# Patient Record
Sex: Male | Born: 2005 | Race: Black or African American | Hispanic: No | Marital: Single
Health system: Southern US, Community
[De-identification: ages and names within clinical notes are randomized; demographics above are authoritative.]

## PROBLEM LIST (undated history)

## (undated) DIAGNOSIS — F32A Depression, unspecified: Secondary | ICD-10-CM

## (undated) DIAGNOSIS — M722 Plantar fascial fibromatosis: Secondary | ICD-10-CM

## (undated) DIAGNOSIS — S24103A Unspecified injury at T7-T10 level of thoracic spinal cord, initial encounter: Secondary | ICD-10-CM

## (undated) DIAGNOSIS — Z87442 Personal history of urinary calculi: Secondary | ICD-10-CM

## (undated) DIAGNOSIS — R519 Headache, unspecified: Secondary | ICD-10-CM

## (undated) DIAGNOSIS — F419 Anxiety disorder, unspecified: Secondary | ICD-10-CM

## (undated) DIAGNOSIS — W3400XA Accidental discharge from unspecified firearms or gun, initial encounter: Secondary | ICD-10-CM

## (undated) HISTORY — PX: DG FOOT RIGHT COMPLETE (ARMC HX): HXRAD1522

---
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
## 2023-02-20 DIAGNOSIS — W3400XA Accidental discharge from unspecified firearms or gun, initial encounter: Secondary | ICD-10-CM

## 2023-02-20 DIAGNOSIS — Z978 Presence of other specified devices: Secondary | ICD-10-CM

## 2023-02-20 HISTORY — DX: Presence of other specified devices: Z97.8

## 2023-02-20 HISTORY — DX: Accidental discharge from unspecified firearms or gun, initial encounter: W34.00XA

## 2023-03-07 ENCOUNTER — Emergency Department (HOSPITAL_COMMUNITY): Payer: Medicaid Other

## 2023-03-07 ENCOUNTER — Encounter (HOSPITAL_COMMUNITY): Payer: Self-pay

## 2023-03-07 ENCOUNTER — Inpatient Hospital Stay (HOSPITAL_COMMUNITY)
Admission: EM | Admit: 2023-03-07 | Discharge: 2023-03-11 | DRG: 964 | Disposition: A | Discharge status: Rehabilitation Facility | Payer: Medicaid Other | Attending: Surgery | Admitting: Surgery

## 2023-03-07 ENCOUNTER — Other Ambulatory Visit: Payer: Self-pay

## 2023-03-07 DIAGNOSIS — S22089A Unspecified fracture of T11-T12 vertebra, initial encounter for closed fracture: Secondary | ICD-10-CM

## 2023-03-07 DIAGNOSIS — J942 Hemothorax: Secondary | ICD-10-CM

## 2023-03-07 DIAGNOSIS — W3400XA Accidental discharge from unspecified firearms or gun, initial encounter: Principal | ICD-10-CM

## 2023-03-07 DIAGNOSIS — G8221 Paraplegia, complete: Secondary | ICD-10-CM | POA: Diagnosis present

## 2023-03-07 DIAGNOSIS — S27321A Contusion of lung, unilateral, initial encounter: Secondary | ICD-10-CM | POA: Diagnosis present

## 2023-03-07 DIAGNOSIS — S272XXA Traumatic hemopneumothorax, initial encounter: Secondary | ICD-10-CM | POA: Diagnosis present

## 2023-03-07 DIAGNOSIS — S21239A Puncture wound without foreign body of unspecified back wall of thorax without penetration into thoracic cavity, initial encounter: Secondary | ICD-10-CM | POA: Diagnosis present

## 2023-03-07 DIAGNOSIS — F4322 Adjustment disorder with anxiety: Secondary | ICD-10-CM | POA: Diagnosis not present

## 2023-03-07 DIAGNOSIS — K592 Neurogenic bowel, not elsewhere classified: Secondary | ICD-10-CM | POA: Diagnosis present

## 2023-03-07 DIAGNOSIS — S2231XA Fracture of one rib, right side, initial encounter for closed fracture: Secondary | ICD-10-CM | POA: Diagnosis present

## 2023-03-07 DIAGNOSIS — Y92410 Unspecified street and highway as the place of occurrence of the external cause: Secondary | ICD-10-CM

## 2023-03-07 DIAGNOSIS — S36115A Moderate laceration of liver, initial encounter: Secondary | ICD-10-CM | POA: Diagnosis present

## 2023-03-07 DIAGNOSIS — F1220 Cannabis dependence, uncomplicated: Secondary | ICD-10-CM | POA: Diagnosis present

## 2023-03-07 DIAGNOSIS — S27802A Contusion of diaphragm, initial encounter: Secondary | ICD-10-CM | POA: Diagnosis present

## 2023-03-07 DIAGNOSIS — S2231XD Fracture of one rib, right side, subsequent encounter for fracture with routine healing: Secondary | ICD-10-CM | POA: Diagnosis not present

## 2023-03-07 DIAGNOSIS — F430 Acute stress reaction: Secondary | ICD-10-CM | POA: Diagnosis not present

## 2023-03-07 DIAGNOSIS — S24154A Other incomplete lesion at T11-T12 level of thoracic spinal cord, initial encounter: Secondary | ICD-10-CM | POA: Diagnosis present

## 2023-03-07 DIAGNOSIS — F4320 Adjustment disorder, unspecified: Secondary | ICD-10-CM | POA: Diagnosis present

## 2023-03-07 DIAGNOSIS — N319 Neuromuscular dysfunction of bladder, unspecified: Secondary | ICD-10-CM | POA: Diagnosis present

## 2023-03-07 LAB — I-STAT CHEM 8, ED
BUN: 11 mg/dL (ref 6–20)
Calcium, Ion: 1.14 mmol/L — ABNORMAL LOW (ref 1.15–1.40)
Chloride: 105 mmol/L (ref 98–111)
Creatinine, Ser: 0.9 mg/dL (ref 0.61–1.24)
Glucose, Bld: 188 mg/dL — ABNORMAL HIGH (ref 70–99)
HCT: 37 % — ABNORMAL LOW (ref 39.0–52.0)
Hemoglobin: 12.6 g/dL — ABNORMAL LOW (ref 13.0–17.0)
Potassium: 3.7 mmol/L (ref 3.5–5.1)
Sodium: 139 mmol/L (ref 135–145)
TCO2: 26 mmol/L (ref 22–32)

## 2023-03-07 LAB — URINALYSIS, ROUTINE W REFLEX MICROSCOPIC
Bilirubin Urine: NEGATIVE
Glucose, UA: NEGATIVE mg/dL
Hgb urine dipstick: NEGATIVE
Ketones, ur: NEGATIVE mg/dL
Nitrite: NEGATIVE
Protein, ur: NEGATIVE mg/dL
Specific Gravity, Urine: 1.035 — ABNORMAL HIGH (ref 1.005–1.030)
pH: 6 (ref 5.0–8.0)

## 2023-03-07 LAB — COMPREHENSIVE METABOLIC PANEL
ALT: 55 U/L — ABNORMAL HIGH (ref 0–44)
AST: 66 U/L — ABNORMAL HIGH (ref 15–41)
Albumin: 3.3 g/dL — ABNORMAL LOW (ref 3.5–5.0)
Alkaline Phosphatase: 37 U/L — ABNORMAL LOW (ref 38–126)
Anion gap: 12 (ref 5–15)
BUN: 10 mg/dL (ref 6–20)
CO2: 21 mmol/L — ABNORMAL LOW (ref 22–32)
Calcium: 8.3 mg/dL — ABNORMAL LOW (ref 8.9–10.3)
Chloride: 104 mmol/L (ref 98–111)
Creatinine, Ser: 1.05 mg/dL (ref 0.61–1.24)
GFR, Estimated: >60 mL/min (ref >60)
Glucose, Bld: 191 mg/dL — ABNORMAL HIGH (ref 70–99)
Potassium: 3.7 mmol/L (ref 3.5–5.1)
Sodium: 137 mmol/L (ref 135–145)
Total Bilirubin: 1.1 mg/dL (ref 0.3–1.2)
Total Protein: 5.5 g/dL — ABNORMAL LOW (ref 6.5–8.1)

## 2023-03-07 LAB — CBC
HCT: 37.6 % — ABNORMAL LOW (ref 39.0–52.0)
Hemoglobin: 11.6 g/dL — ABNORMAL LOW (ref 13.0–17.0)
MCH: 27.7 pg (ref 26.0–34.0)
MCHC: 30.9 g/dL (ref 30.0–36.0)
MCV: 89.7 fL (ref 80.0–100.0)
Platelets: 210 10*3/uL (ref 150–400)
RBC: 4.19 MIL/uL — ABNORMAL LOW (ref 4.22–5.81)
RDW: 13.8 % (ref 11.5–15.5)
WBC: 14.3 10*3/uL — ABNORMAL HIGH (ref 4.0–10.5)
nRBC: 0 % (ref 0.0–0.2)

## 2023-03-07 LAB — ETHANOL: Alcohol, Ethyl (B): <10 mg/dL (ref <10)

## 2023-03-07 LAB — HIV ANTIBODY (ROUTINE TESTING W REFLEX): HIV Screen 4th Generation wRfx: NONREACTIVE

## 2023-03-07 LAB — SAMPLE TO BLOOD BANK

## 2023-03-07 LAB — PROTIME-INR
INR: 1.3 — ABNORMAL HIGH (ref 0.8–1.2)
Prothrombin Time: 16 seconds — ABNORMAL HIGH (ref 11.4–15.2)

## 2023-03-07 LAB — LACTIC ACID, PLASMA: Lactic Acid, Venous: 2.7 mmol/L (ref 0.5–1.9)

## 2023-03-07 MED ORDER — OXYCODONE HCL 5 MG PO TABS
5.0000 mg | ORAL_TABLET | ORAL | Status: DC | PRN
  Administered 2023-03-07: 10 mg via ORAL
  Administered 2023-03-07: 5 mg via ORAL
  Administered 2023-03-08 – 2023-03-10 (×8): 10 mg via ORAL
  Filled 2023-03-07 (×5): qty 2
  Filled 2023-03-07: qty 1
  Filled 2023-03-07 (×5): qty 2

## 2023-03-07 MED ORDER — DOCUSATE SODIUM 100 MG PO CAPS
100.0000 mg | ORAL_CAPSULE | Freq: Two times a day (BID) | ORAL | Status: DC
  Administered 2023-03-07 – 2023-03-11 (×7): 100 mg via ORAL
  Filled 2023-03-07 (×8): qty 1

## 2023-03-07 MED ORDER — GABAPENTIN 300 MG PO CAPS
300.0000 mg | ORAL_CAPSULE | Freq: Three times a day (TID) | ORAL | Status: DC
  Administered 2023-03-07 – 2023-03-09 (×8): 300 mg via ORAL
  Filled 2023-03-07 (×8): qty 1

## 2023-03-07 MED ORDER — POLYETHYLENE GLYCOL 3350 17 G PO PACK
17.0000 g | PACK | Freq: Every day | ORAL | Status: DC | PRN
  Filled 2023-03-07: qty 1

## 2023-03-07 MED ORDER — SODIUM CHLORIDE 0.9 % IV SOLN
INTRAVENOUS | Status: AC | PRN
  Administered 2023-03-07: 1000 mL via INTRAVENOUS

## 2023-03-07 MED ORDER — METOPROLOL TARTRATE 5 MG/5ML IV SOLN: 5.0000 mg | Freq: Four times a day (QID) | INTRAVENOUS | Status: DC | PRN

## 2023-03-07 MED ORDER — FENTANYL CITRATE PF 50 MCG/ML IJ SOSY
PREFILLED_SYRINGE | INTRAMUSCULAR | Status: AC
  Filled 2023-03-07: qty 1

## 2023-03-07 MED ORDER — HYDRALAZINE HCL 20 MG/ML IJ SOLN: 10.0000 mg | INTRAMUSCULAR | Status: DC | PRN

## 2023-03-07 MED ORDER — ONDANSETRON HCL 4 MG/2ML IJ SOLN
4.0000 mg | Freq: Four times a day (QID) | INTRAMUSCULAR | Status: DC | PRN
  Administered 2023-03-09 (×2): 4 mg via INTRAVENOUS
  Filled 2023-03-07 (×3): qty 2

## 2023-03-07 MED ORDER — ACETAMINOPHEN 500 MG PO TABS
1000.0000 mg | ORAL_TABLET | Freq: Four times a day (QID) | ORAL | Status: DC
  Administered 2023-03-07 – 2023-03-11 (×16): 1000 mg via ORAL
  Filled 2023-03-07 (×16): qty 2

## 2023-03-07 MED ORDER — METHOCARBAMOL 500 MG PO TABS
500.0000 mg | ORAL_TABLET | Freq: Three times a day (TID) | ORAL | Status: AC
  Administered 2023-03-07 – 2023-03-10 (×8): 500 mg via ORAL
  Filled 2023-03-07 (×9): qty 1

## 2023-03-07 MED ORDER — ONDANSETRON 4 MG PO TBDP: 4.0000 mg | ORAL_TABLET | Freq: Four times a day (QID) | ORAL | Status: DC | PRN

## 2023-03-07 MED ORDER — IOHEXOL 350 MG/ML SOLN
75.0000 mL | Freq: Once | INTRAVENOUS | Status: AC | PRN
  Administered 2023-03-07: 75 mL via INTRAVENOUS

## 2023-03-07 MED ORDER — METHOCARBAMOL 1000 MG/10ML IJ SOLN
500.0000 mg | Freq: Three times a day (TID) | INTRAVENOUS | Status: AC
  Filled 2023-03-07: qty 5

## 2023-03-07 MED ORDER — MORPHINE SULFATE (PF) 4 MG/ML IV SOLN
4.0000 mg | INTRAVENOUS | Status: DC | PRN
  Administered 2023-03-07 – 2023-03-08 (×6): 4 mg via INTRAVENOUS
  Filled 2023-03-07 (×8): qty 1

## 2023-03-07 MED ORDER — FENTANYL CITRATE PF 50 MCG/ML IJ SOSY
50.0000 ug | PREFILLED_SYRINGE | Freq: Once | INTRAMUSCULAR | Status: AC
  Administered 2023-03-07: 50 ug via INTRAVENOUS

## 2023-03-07 NOTE — ED Notes (Signed)
Chest tube placed by Bedelia Person, MD
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
## 2023-03-07 NOTE — ED Notes (Signed)
Pt transported to CT with RN on monitor. 
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
## 2023-03-07 NOTE — TOC CAGE-AID Note (Signed)
Transition of Care [facility]) - CAGE-AID Screening   Patient Details  Name: Lee [name] MRN: [id number] Date of Birth: [birth year]     Hewitt Shorts, RN Trauma Response Nurse Phone Number: [phone] 03/07/2023, 7:01 PM     CAGE-AID Screening:    Have You Ever Felt You Ought to Cut Down on Your Drinking or Drug Use?: No Have People Annoyed You By Critizing Your Drinking Or Drug Use?: No Have You Felt Bad Or Guilty About Your Drinking Or Drug Use?: No Have You Ever Had a Drink or Used Drugs First Thing In The Morning to Steady Your Nerves or to Get Rid of a Hangover?: No CAGE-AID Score: 0  Substance Abuse Education Offered: No
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
## 2023-03-07 NOTE — Progress Notes (Signed)
Orthopedic Tech Progress Note Patient Details:  Lee [name] 2006-05-17 [id number]  Patient ID: [name], male   DOB: [birth year], 17 y.o.   MRN: [id number] Level I; not needed. Darleen Crocker 03/07/2023, 4:25 PM
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
## 2023-03-07 NOTE — ED Notes (Signed)
Date and time results received: 03/07/23 1704 (use smartphrase ".now" to insert current time)  Test: lactic acid Critical Value: 2.9  Name of Provider Notified: Adela Lank, MD  Orders Received? Or Actions Taken?:  notified

## 2023-03-07 NOTE — ED Notes (Signed)
 of blood out put from chest tube.

## 2023-03-08 ENCOUNTER — Inpatient Hospital Stay (HOSPITAL_COMMUNITY): Payer: Medicaid Other

## 2023-03-08 LAB — CBC
HCT: 36.2 % — ABNORMAL LOW (ref 39.0–52.0)
HCT: 36 % — ABNORMAL LOW (ref 39.0–52.0)
Hemoglobin: 11.4 g/dL — ABNORMAL LOW (ref 13.0–17.0)
Hemoglobin: 11.2 g/dL — ABNORMAL LOW (ref 13.0–17.0)
MCH: 28.3 pg (ref 26.0–34.0)
MCH: 27.5 pg (ref 26.0–34.0)
MCHC: 31.5 g/dL (ref 30.0–36.0)
MCHC: 31.1 g/dL (ref 30.0–36.0)
MCV: 89.8 fL (ref 80.0–100.0)
MCV: 88.5 fL (ref 80.0–100.0)
Platelets: 154 10*3/uL (ref 150–400)
Platelets: UNDETERMINED 10*3/uL (ref 150–400)
RBC: 4.03 MIL/uL — ABNORMAL LOW (ref 4.22–5.81)
RBC: 4.07 MIL/uL — ABNORMAL LOW (ref 4.22–5.81)
RDW: 13.8 % (ref 11.5–15.5)
RDW: 13.7 % (ref 11.5–15.5)
WBC: 14.3 10*3/uL — ABNORMAL HIGH (ref 4.0–10.5)
WBC: 10.5 10*3/uL (ref 4.0–10.5)
nRBC: 0 % (ref 0.0–0.2)
nRBC: 0 % (ref 0.0–0.2)

## 2023-03-08 LAB — BASIC METABOLIC PANEL
Anion gap: 12 (ref 5–15)
BUN: 9 mg/dL (ref 6–20)
CO2: 24 mmol/L (ref 22–32)
Calcium: 8.9 mg/dL (ref 8.9–10.3)
Chloride: 102 mmol/L (ref 98–111)
Creatinine, Ser: 0.95 mg/dL (ref 0.61–1.24)
GFR, Estimated: >60 mL/min (ref >60)
Glucose, Bld: 125 mg/dL — ABNORMAL HIGH (ref 70–99)
Potassium: 4.1 mmol/L (ref 3.5–5.1)
Sodium: 138 mmol/L (ref 135–145)
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
## 2023-03-08 NOTE — Progress Notes (Signed)
Orthopedic Tech Progress Note Patient Details:  Lee [name] Apr 12, 2006 [id number]  Ortho Devices Type of Ortho Device: Thoracolumbar corset (TLSO) Ortho Device/Splint Location: BACK Ortho Device/Splint Interventions: Ordered, Application, Adjustment   Post Interventions Patient Tolerated: Well Instructions Provided: Care of device  Donald Pore 03/08/2023, 5:07 PM

## 2023-03-09 ENCOUNTER — Inpatient Hospital Stay (HOSPITAL_COMMUNITY): Payer: Medicaid Other

## 2023-03-09 LAB — CBC
HCT: 33.1 % — ABNORMAL LOW (ref 39.0–52.0)
Hemoglobin: 10.3 g/dL — ABNORMAL LOW (ref 13.0–17.0)
MCH: 27.5 pg (ref 26.0–34.0)
MCHC: 31.1 g/dL (ref 30.0–36.0)
MCV: 88.5 fL (ref 80.0–100.0)
Platelets: 141 10*3/uL — ABNORMAL LOW (ref 150–400)
RBC: 3.74 MIL/uL — ABNORMAL LOW (ref 4.22–5.81)
RDW: 13.7 % (ref 11.5–15.5)
WBC: 7.9 10*3/uL (ref 4.0–10.5)
nRBC: 0 % (ref 0.0–0.2)

## 2023-03-09 LAB — BASIC METABOLIC PANEL
Anion gap: 8 (ref 5–15)
BUN: 7 mg/dL (ref 6–20)
CO2: 30 mmol/L (ref 22–32)
Calcium: 8.8 mg/dL — ABNORMAL LOW (ref 8.9–10.3)
Chloride: 98 mmol/L (ref 98–111)
Creatinine, Ser: 1.02 mg/dL (ref 0.61–1.24)
GFR, Estimated: >60 mL/min (ref >60)
Glucose, Bld: 101 mg/dL — ABNORMAL HIGH (ref 70–99)
Potassium: 3.9 mmol/L (ref 3.5–5.1)
Sodium: 136 mmol/L (ref 135–145)

## 2023-03-09 MED ORDER — LIDOCAINE 5 % EX PTCH
1.0000 | MEDICATED_PATCH | CUTANEOUS | Status: DC
  Administered 2023-03-09 – 2023-03-11 (×3): 1 via TRANSDERMAL
  Filled 2023-03-09 (×3): qty 1

## 2023-03-09 MED ORDER — SODIUM CHLORIDE 0.9 % IV BOLUS
1000.0000 mL | Freq: Once | INTRAVENOUS | Status: AC
  Administered 2023-03-09: 1000 mL via INTRAVENOUS

## 2023-03-09 MED ORDER — MORPHINE SULFATE (PF) 2 MG/ML IV SOLN
2.0000 mg | Freq: Four times a day (QID) | INTRAVENOUS | Status: DC | PRN
  Administered 2023-03-10: 2 mg via INTRAVENOUS
  Filled 2023-03-09: qty 1

## 2023-03-09 MED ORDER — KETOROLAC TROMETHAMINE 15 MG/ML IJ SOLN
30.0000 mg | Freq: Four times a day (QID) | INTRAMUSCULAR | Status: DC
  Administered 2023-03-09 – 2023-03-11 (×8): 30 mg via INTRAVENOUS
  Filled 2023-03-09 (×9): qty 2

## 2023-03-09 MED ORDER — DULOXETINE HCL 30 MG PO CPEP
30.0000 mg | ORAL_CAPSULE | Freq: Two times a day (BID) | ORAL | Status: DC
  Administered 2023-03-09 – 2023-03-11 (×5): 30 mg via ORAL
  Filled 2023-03-09 (×5): qty 1

## 2023-03-09 MED ORDER — CHLORHEXIDINE GLUCONATE CLOTH 2 % EX PADS
6.0000 | MEDICATED_PAD | Freq: Every day | CUTANEOUS | Status: DC
  Administered 2023-03-09 – 2023-03-11 (×3): 6 via TOPICAL

## 2023-03-09 MED ORDER — BOOST / RESOURCE BREEZE PO LIQD CUSTOM
1.0000 | Freq: Two times a day (BID) | ORAL | Status: DC
  Administered 2023-03-09 – 2023-03-11 (×5): 1 via ORAL
  Filled 2023-03-09: qty 1
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
## 2023-03-09 NOTE — Progress Notes (Signed)
? ?  Inpatient Rehab Admissions Coordinator : ? ?Per therapy recommendations, patient was screened for CIR candidacy by Edelin Fryer RN MSN.  At this time patient appears to be a potential candidate for CIR. I will place a rehab consult per protocol for full assessment. Please call me with any questions. ? ?Zayvon Alicea RN MSN ?Admissions Coordinator ?[phone] ?  ?

## 2023-03-09 NOTE — Progress Notes (Signed)
Discussed with Dr. Wynetta Emery, who has reviewed upright images. Okay to mobilize in brace. Begin therapies.   Diamantina Monks, MD General and Trauma Surgery [facility]

## 2023-03-09 NOTE — Consult Note (Signed)
  Patient seen and attempted to assess. He initially was difficult to arouse by calling of name, he did open his eyes briefly with physical shaking of upper left shoulder.  His grandmother [name]) was present at bedside who reports patient recently received medication for pain.  Patient did initially nod head to which she was comfortable with proceeding for psychiatric evaluation.  However he was unable to remain awake, and noted to again not handed for provider to return tomorrow once more stable and not under medications.  Grandmother denied having any immediate or acute concerns at this time for psychiatric provider.  Did discuss reasons for psychiatric consult, in which she verbally agreed psych consult was warranted.  She did states she will contact mother, who presented moments after providing had exited room.   Chart reviewed and verified patient recently received oxycodone 10 mg at 11:16 AM, also received a dose of Zofran 4 mg IV at 8:00 AM, in addition to Toradol 30 mg (nonsedating), gabapentin 300 mg p.o., and Cymbalta 30 mg p.o..  Although some medications are nonsedating, will revisit patient tomorrow to complete psychiatric evaluation for new paraplegia.  Psychiatry consult service will continue to follow.
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
## 2023-03-10 ENCOUNTER — Inpatient Hospital Stay (HOSPITAL_COMMUNITY): Payer: Medicaid Other

## 2023-03-10 LAB — BASIC METABOLIC PANEL
Anion gap: 7 (ref 5–15)
BUN: 10 mg/dL (ref 6–20)
CO2: 29 mmol/L (ref 22–32)
Calcium: 8.8 mg/dL — ABNORMAL LOW (ref 8.9–10.3)
Chloride: 99 mmol/L (ref 98–111)
Creatinine, Ser: 0.9 mg/dL (ref 0.61–1.24)
GFR, Estimated: >60 mL/min (ref >60)
Glucose, Bld: 93 mg/dL (ref 70–99)
Potassium: 4 mmol/L (ref 3.5–5.1)
Sodium: 135 mmol/L (ref 135–145)

## 2023-03-10 LAB — CBC
HCT: 31.9 % — ABNORMAL LOW (ref 39.0–52.0)
Hemoglobin: 10.3 g/dL — ABNORMAL LOW (ref 13.0–17.0)
MCH: 28.7 pg (ref 26.0–34.0)
MCHC: 32.3 g/dL (ref 30.0–36.0)
MCV: 88.9 fL (ref 80.0–100.0)
Platelets: 125 10*3/uL — ABNORMAL LOW (ref 150–400)
RBC: 3.59 MIL/uL — ABNORMAL LOW (ref 4.22–5.81)
RDW: 13.3 % (ref 11.5–15.5)
WBC: 6.6 10*3/uL (ref 4.0–10.5)
nRBC: 0 % (ref 0.0–0.2)

## 2023-03-10 MED ORDER — OXYCODONE HCL 5 MG PO TABS: 10.0000 mg | ORAL_TABLET | ORAL | Status: DC | PRN

## 2023-03-10 MED ORDER — BISACODYL 10 MG RE SUPP
10.0000 mg | Freq: Every day | RECTAL | Status: DC
  Administered 2023-03-10: 10 mg via RECTAL
  Filled 2023-03-10 (×2): qty 1

## 2023-03-10 MED ORDER — OXYCODONE HCL 5 MG PO TABS
10.0000 mg | ORAL_TABLET | ORAL | Status: DC | PRN
  Administered 2023-03-11: 10 mg via ORAL
  Filled 2023-03-10: qty 2

## 2023-03-10 MED ORDER — METHOCARBAMOL 500 MG PO TABS
1000.0000 mg | ORAL_TABLET | Freq: Three times a day (TID) | ORAL | Status: DC
  Administered 2023-03-10 – 2023-03-11 (×5): 1000 mg via ORAL
  Filled 2023-03-10 (×5): qty 2

## 2023-03-10 MED ORDER — GABAPENTIN 400 MG PO CAPS
400.0000 mg | ORAL_CAPSULE | Freq: Three times a day (TID) | ORAL | Status: DC
  Administered 2023-03-10 – 2023-03-11 (×5): 400 mg via ORAL
  Filled 2023-03-10 (×5): qty 1

## 2023-03-10 MED ORDER — ENOXAPARIN SODIUM 30 MG/0.3ML IJ SOSY
30.0000 mg | PREFILLED_SYRINGE | Freq: Two times a day (BID) | INTRAMUSCULAR | Status: DC
  Administered 2023-03-10 – 2023-03-11 (×2): 30 mg via SUBCUTANEOUS
  Filled 2023-03-10 (×2): qty 0.3

## 2023-03-10 MED ORDER — METHOCARBAMOL 1000 MG/10ML IJ SOLN: 500.0000 mg | Freq: Three times a day (TID) | INTRAVENOUS | Status: DC

## 2023-03-10 MED ORDER — POLYETHYLENE GLYCOL 3350 17 G PO PACK
17.0000 g | PACK | Freq: Every day | ORAL | Status: DC
  Administered 2023-03-10: 17 g via ORAL
  Filled 2023-03-10: qty 1
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
## 2023-03-10 NOTE — Plan of Care (Signed)
  Problem: Pain Managment: Goal: General experience of comfort will improve Outcome: Progressing   Problem: Education: Goal: Knowledge of General Education information will improve Description: Including pain rating scale, medication(s)/side effects and non-pharmacologic comfort measures Outcome: Progressing   Problem: Health Behavior/Discharge Planning: Goal: Ability to manage health-related needs will improve Outcome: Progressing   Problem: Activity: Goal: Risk for activity intolerance will decrease Outcome: Progressing   Problem: Coping: Goal: Level of anxiety will decrease Outcome: Progressing   Problem: Pain Managment: Goal: General experience of comfort will improve Outcome: Progressing   Problem: Skin Integrity: Goal: Risk for impaired skin integrity will decrease Outcome: Progressing
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
## 2023-03-11 ENCOUNTER — Other Ambulatory Visit: Payer: Self-pay

## 2023-03-11 ENCOUNTER — Inpatient Hospital Stay (HOSPITAL_COMMUNITY)
Admission: RE | Admit: 2023-03-11 | Discharge: 2023-04-08 | DRG: 945 | Disposition: A | Discharge status: Home / Self Care | Payer: Medicaid Other | Source: Intra-hospital | Attending: Physical Medicine and Rehabilitation | Admitting: Physical Medicine and Rehabilitation

## 2023-03-11 ENCOUNTER — Encounter (HOSPITAL_BASED_OUTPATIENT_CLINIC_OR_DEPARTMENT_OTHER): Payer: Self-pay | Admitting: Emergency Medicine

## 2023-03-11 DIAGNOSIS — S24104A Unspecified injury at T11-T12 level of thoracic spinal cord, initial encounter: Secondary | ICD-10-CM | POA: Diagnosis present

## 2023-03-11 DIAGNOSIS — A749 Chlamydial infection, unspecified: Secondary | ICD-10-CM | POA: Diagnosis present

## 2023-03-11 DIAGNOSIS — D62 Acute posthemorrhagic anemia: Secondary | ICD-10-CM | POA: Diagnosis present

## 2023-03-11 DIAGNOSIS — Z79899 Other long term (current) drug therapy: Secondary | ICD-10-CM | POA: Diagnosis not present

## 2023-03-11 DIAGNOSIS — S36115D Moderate laceration of liver, subsequent encounter: Secondary | ICD-10-CM

## 2023-03-11 DIAGNOSIS — K592 Neurogenic bowel, not elsewhere classified: Secondary | ICD-10-CM | POA: Diagnosis present

## 2023-03-11 DIAGNOSIS — S24154D Other incomplete lesion at T11-T12 level of thoracic spinal cord, subsequent encounter: Principal | ICD-10-CM

## 2023-03-11 DIAGNOSIS — S24114D Complete lesion at T11-T12 level of thoracic spinal cord, subsequent encounter: Principal | ICD-10-CM

## 2023-03-11 DIAGNOSIS — Z560 Unemployment, unspecified: Secondary | ICD-10-CM

## 2023-03-11 DIAGNOSIS — M549 Dorsalgia, unspecified: Secondary | ICD-10-CM | POA: Diagnosis present

## 2023-03-11 DIAGNOSIS — R519 Headache, unspecified: Secondary | ICD-10-CM | POA: Diagnosis not present

## 2023-03-11 DIAGNOSIS — S27329D Contusion of lung, unspecified, subsequent encounter: Secondary | ICD-10-CM | POA: Diagnosis not present

## 2023-03-11 DIAGNOSIS — G8221 Paraplegia, complete: Secondary | ICD-10-CM | POA: Diagnosis present

## 2023-03-11 DIAGNOSIS — K5900 Constipation, unspecified: Secondary | ICD-10-CM | POA: Diagnosis present

## 2023-03-11 DIAGNOSIS — S27809D Unspecified injury of diaphragm, subsequent encounter: Secondary | ICD-10-CM | POA: Diagnosis not present

## 2023-03-11 DIAGNOSIS — G8220 Paraplegia, unspecified: Secondary | ICD-10-CM | POA: Diagnosis present

## 2023-03-11 DIAGNOSIS — W3400XA Accidental discharge from unspecified firearms or gun, initial encounter: Secondary | ICD-10-CM

## 2023-03-11 DIAGNOSIS — S271XXD Traumatic hemothorax, subsequent encounter: Secondary | ICD-10-CM | POA: Diagnosis not present

## 2023-03-11 DIAGNOSIS — N319 Neuromuscular dysfunction of bladder, unspecified: Secondary | ICD-10-CM | POA: Diagnosis present

## 2023-03-11 DIAGNOSIS — S2231XD Fracture of one rib, right side, subsequent encounter for fracture with routine healing: Secondary | ICD-10-CM | POA: Diagnosis present

## 2023-03-11 DIAGNOSIS — R112 Nausea with vomiting, unspecified: Secondary | ICD-10-CM | POA: Diagnosis not present

## 2023-03-11 DIAGNOSIS — S24103A Unspecified injury at T7-T10 level of thoracic spinal cord, initial encounter: Secondary | ICD-10-CM

## 2023-03-11 DIAGNOSIS — F4320 Adjustment disorder, unspecified: Secondary | ICD-10-CM

## 2023-03-11 LAB — CBC
HCT: 32.6 % — ABNORMAL LOW (ref 39.0–52.0)
HCT: 32.5 % — ABNORMAL LOW (ref 39.0–52.0)
Hemoglobin: 10.3 g/dL — ABNORMAL LOW (ref 13.0–17.0)
Hemoglobin: 10.4 g/dL — ABNORMAL LOW (ref 13.0–17.0)
MCH: 27.5 pg (ref 26.0–34.0)
MCH: 28.3 pg (ref 26.0–34.0)
MCHC: 31.6 g/dL (ref 30.0–36.0)
MCHC: 32 g/dL (ref 30.0–36.0)
MCV: 86.9 fL (ref 80.0–100.0)
MCV: 88.3 fL (ref 80.0–100.0)
Platelets: 132 K/uL — ABNORMAL LOW (ref 150–400)
Platelets: 152 10*3/uL (ref 150–400)
RBC: 3.75 MIL/uL — ABNORMAL LOW (ref 4.22–5.81)
RBC: 3.68 MIL/uL — ABNORMAL LOW (ref 4.22–5.81)
RDW: 13.1 % (ref 11.5–15.5)
RDW: 13.2 % (ref 11.5–15.5)
WBC: 6.2 K/uL (ref 4.0–10.5)
WBC: 5.5 10*3/uL (ref 4.0–10.5)
nRBC: 0 % (ref 0.0–0.2)
nRBC: 0 % (ref 0.0–0.2)

## 2023-03-11 LAB — BASIC METABOLIC PANEL
Anion gap: 8 (ref 5–15)
BUN: 10 mg/dL (ref 6–20)
CO2: 26 mmol/L (ref 22–32)
Calcium: 8.7 mg/dL — ABNORMAL LOW (ref 8.9–10.3)
Chloride: 102 mmol/L (ref 98–111)
Creatinine, Ser: 0.8 mg/dL (ref 0.61–1.24)
GFR, Estimated: >60 mL/min (ref >60)
Glucose, Bld: 96 mg/dL (ref 70–99)
Potassium: 3.8 mmol/L (ref 3.5–5.1)
Sodium: 136 mmol/L (ref 135–145)

## 2023-03-11 LAB — CREATININE, SERUM
Creatinine, Ser: 0.86 mg/dL (ref 0.61–1.24)
GFR, Estimated: >60 mL/min (ref >60)

## 2023-03-11 MED ORDER — POLYETHYLENE GLYCOL 3350 17 G PO PACK
17.0000 g | PACK | Freq: Two times a day (BID) | ORAL | Status: DC
  Administered 2023-03-12 – 2023-03-21 (×16): 17 g via ORAL
  Filled 2023-03-11 (×20): qty 1

## 2023-03-11 MED ORDER — ACETAMINOPHEN 325 MG PO TABS
325.0000 mg | ORAL_TABLET | ORAL | Status: DC | PRN
  Administered 2023-03-19 – 2023-04-02 (×8): 650 mg via ORAL
  Filled 2023-03-11 (×9): qty 2

## 2023-03-11 MED ORDER — ONDANSETRON 4 MG PO TBDP
4.0000 mg | ORAL_TABLET | Freq: Four times a day (QID) | ORAL | Status: DC | PRN
  Administered 2023-03-19 – 2023-03-23 (×4): 4 mg via ORAL
  Filled 2023-03-11 (×4): qty 1

## 2023-03-11 MED ORDER — METHOCARBAMOL 500 MG PO TABS
1000.0000 mg | ORAL_TABLET | Freq: Three times a day (TID) | ORAL | Status: AC
  Administered 2023-03-11 – 2023-03-13 (×5): 1000 mg via ORAL
  Filled 2023-03-11 (×5): qty 2

## 2023-03-11 MED ORDER — ENOXAPARIN SODIUM 30 MG/0.3ML IJ SOSY
30.0000 mg | PREFILLED_SYRINGE | Freq: Two times a day (BID) | INTRAMUSCULAR | Status: DC
  Administered 2023-03-12 – 2023-04-05 (×49): 30 mg via SUBCUTANEOUS
  Filled 2023-03-11 (×50): qty 0.3

## 2023-03-11 MED ORDER — MAGNESIUM CITRATE PO SOLN
0.5000 | Freq: Once | ORAL | Status: AC
  Administered 2023-03-11: 0.5 via ORAL
  Filled 2023-03-11: qty 296

## 2023-03-11 MED ORDER — OXYCODONE HCL 5 MG PO TABS
10.0000 mg | ORAL_TABLET | ORAL | Status: DC | PRN
  Administered 2023-03-11: 10 mg via ORAL
  Administered 2023-03-12 (×2): 15 mg via ORAL
  Administered 2023-03-13: 10 mg via ORAL
  Administered 2023-03-14 – 2023-03-15 (×3): 15 mg via ORAL
  Administered 2023-03-15 (×2): 10 mg via ORAL
  Administered 2023-03-16: 15 mg via ORAL
  Administered 2023-03-16: 10 mg via ORAL
  Administered 2023-03-17: 15 mg via ORAL
  Administered 2023-03-17: 10 mg via ORAL
  Administered 2023-03-18 (×2): 15 mg via ORAL
  Administered 2023-03-19: 10 mg via ORAL
  Administered 2023-03-19 – 2023-03-21 (×2): 15 mg via ORAL
  Administered 2023-03-22 – 2023-03-23 (×2): 10 mg via ORAL
  Administered 2023-03-23 – 2023-03-30 (×8): 15 mg via ORAL
  Administered 2023-03-31: 10 mg via ORAL
  Administered 2023-04-01 – 2023-04-06 (×4): 15 mg via ORAL
  Filled 2023-03-11 (×7): qty 3
  Filled 2023-03-11 (×2): qty 2
  Filled 2023-03-11 (×7): qty 3
  Filled 2023-03-11: qty 2
  Filled 2023-03-11: qty 3
  Filled 2023-03-11 (×4): qty 2
  Filled 2023-03-11 (×2): qty 3
  Filled 2023-03-11: qty 2
  Filled 2023-03-11 (×3): qty 3
  Filled 2023-03-11 (×2): qty 2
  Filled 2023-03-11: qty 3
  Filled 2023-03-11: qty 2
  Filled 2023-03-11: qty 3
  Filled 2023-03-11: qty 2
  Filled 2023-03-11 (×2): qty 3

## 2023-03-11 MED ORDER — ORAL CARE MOUTH RINSE: 15.0000 mL | OROMUCOSAL | Status: DC | PRN

## 2023-03-11 MED ORDER — ONDANSETRON HCL 4 MG/2ML IJ SOLN
4.0000 mg | Freq: Four times a day (QID) | INTRAMUSCULAR | Status: DC | PRN
  Administered 2023-03-22: 4 mg via INTRAVENOUS
  Filled 2023-03-11: qty 2

## 2023-03-11 MED ORDER — MAGNESIUM HYDROXIDE 400 MG/5ML PO SUSP
30.0000 mL | Freq: Once | ORAL | Status: AC
  Administered 2023-03-11: 30 mL via ORAL
  Filled 2023-03-11: qty 30

## 2023-03-11 MED ORDER — BOOST / RESOURCE BREEZE PO LIQD CUSTOM
1.0000 | Freq: Two times a day (BID) | ORAL | Status: DC
  Administered 2023-03-12: 1 via ORAL

## 2023-03-11 MED ORDER — DULOXETINE HCL 30 MG PO CPEP
30.0000 mg | ORAL_CAPSULE | Freq: Two times a day (BID) | ORAL | Status: DC
  Administered 2023-03-11 – 2023-03-23 (×25): 30 mg via ORAL
  Filled 2023-03-11 (×25): qty 1

## 2023-03-11 MED ORDER — LIDOCAINE 5 % EX PTCH: 1.0000 | MEDICATED_PATCH | CUTANEOUS | Status: DC

## 2023-03-11 MED ORDER — DOCUSATE SODIUM 100 MG PO CAPS
100.0000 mg | ORAL_CAPSULE | Freq: Two times a day (BID) | ORAL | Status: DC
  Administered 2023-03-11 – 2023-03-18 (×15): 100 mg via ORAL
  Filled 2023-03-11 (×16): qty 1

## 2023-03-11 MED ORDER — BISACODYL 10 MG RE SUPP
10.0000 mg | Freq: Every day | RECTAL | Status: DC
  Filled 2023-03-11: qty 1

## 2023-03-11 MED ORDER — GABAPENTIN 400 MG PO CAPS
400.0000 mg | ORAL_CAPSULE | Freq: Three times a day (TID) | ORAL | Status: DC
  Administered 2023-03-11 – 2023-04-06 (×70): 400 mg via ORAL
  Filled 2023-03-11: qty 4
  Filled 2023-03-11 (×26): qty 1
  Filled 2023-03-11: qty 4
  Filled 2023-03-11 (×49): qty 1

## 2023-03-11 MED ORDER — POLYETHYLENE GLYCOL 3350 17 G PO PACK: 17.0000 g | PACK | Freq: Two times a day (BID) | ORAL | Status: DC

## 2023-03-11 MED ORDER — ENOXAPARIN SODIUM 30 MG/0.3ML IJ SOSY
30.0000 mg | PREFILLED_SYRINGE | Freq: Two times a day (BID) | INTRAMUSCULAR | Status: DC
  Administered 2023-03-11: 30 mg via SUBCUTANEOUS
  Filled 2023-03-11: qty 0.3

## 2023-03-11 MED ORDER — SENNA 8.6 MG PO TABS
2.0000 | ORAL_TABLET | Freq: Once | ORAL | Status: AC
  Administered 2023-03-11: 17.2 mg via ORAL
  Filled 2023-03-11: qty 2
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
## 2023-03-11 NOTE — Progress Notes (Signed)
Inpatient Rehabilitation Admission Medication Review by a Pharmacist  A complete drug regimen review was completed for this patient to identify any potential clinically significant medication issues.  High Risk Drug Classes Is patient taking? Indication by Medication  Antipsychotic No   Anticoagulant Yes Enoxaparin for DVT ppx  Antibiotic No   Opioid Yes Oxycodone for acute pain  Antiplatelet No   Hypoglycemics/insulin No   Vasoactive Medication No   Chemotherapy No   Other Yes Bisacodyl, docusate, miralax for bowel care Duloxetine and gabapentin for nerve pain Methocarbamol for muscle spasms Acetaminophen for mild pain Ondansetron for nausea      Type of Medication Issue Identified Description of Issue Recommendation(s)  Drug Interaction(s) (clinically significant)     Duplicate Therapy     Allergy     No Medication Administration End Date     Incorrect Dose     Additional Drug Therapy Needed     Significant med changes from prior encounter (inform family/care partners about these prior to discharge). All new for patient Review at discharge   Other  Scheduled tylenol not resumed from inpatient Consider resuming for pain control     Clinically significant medication issues were identified that warrant physician communication and completion of prescribed/recommended actions by midnight of the next day:  No  Name of provider notified for urgent issues identified:   Provider Method of Notification:     Pharmacist comments:   Time spent performing this drug regimen review (minutes):  15   Alphia Moh, PharmD, [place], [facility] Clinical Pharmacist  Please check AMION for all [facility] phone numbers After 10:00 PM, call Main Pharmacy [phone]
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
## 2023-03-11 NOTE — Progress Notes (Signed)
INPATIENT REHABILITATION ADMISSION NOTE   Arrival Method: Bed      Mental Orientation: Oriented X4   Assessment: done   Skin: assessed   IV'S: present   Pain: none   Tubes and Drains: foley in palce   Safety Measures: reviewed   Vital Signs: done   Height and Weight: done   Rehab Orientation: done   Family: at bedside    Notes: done  Marylu Lund, RN
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
## 2023-03-11 NOTE — Progress Notes (Signed)
Pt refused his suppository and miralax for tonight. Educated him on the importance of it and explained to him that it would be a daily medication that would be given. Pt verbalized understanding. Pt was given magnesium citrate from previous unit and states that he plans to drink the rest of that tonight.
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
## 2023-03-11 NOTE — Progress Notes (Signed)
IP rehab admissions - I met with patient, his mom, his grandmother and his aunt.  Patient lives with his dad.  Dad is currently not working but looking for a job.  Did was recently released after 14 years in prison.  Mom recently moved to [place] with her daughter.  Patient did not want to go to [place].  Mom pays for the rental house for dad and patient.  Patient would like to go home with dad.  His 2nd choice would be to go home with his aunt Lee [name].  Mom feels very stressed with current situation.  She is also looking for other housing for patient that would be more w/c accessible.  For now, the plan is home to dad's home with help of mom and aunt.  Patient has been cleared for CIR today and we can admit to CIr today.  Call me for questions.  [phone]

## 2023-03-12 ENCOUNTER — Encounter (HOSPITAL_COMMUNITY): Payer: Self-pay | Admitting: Physical Medicine and Rehabilitation

## 2023-03-12 ENCOUNTER — Inpatient Hospital Stay (HOSPITAL_COMMUNITY): Payer: Medicaid Other

## 2023-03-12 DIAGNOSIS — G8221 Paraplegia, complete: Secondary | ICD-10-CM

## 2023-03-12 DIAGNOSIS — K592 Neurogenic bowel, not elsewhere classified: Secondary | ICD-10-CM | POA: Diagnosis present

## 2023-03-12 DIAGNOSIS — N319 Neuromuscular dysfunction of bladder, unspecified: Secondary | ICD-10-CM

## 2023-03-12 DIAGNOSIS — M7989 Other specified soft tissue disorders: Secondary | ICD-10-CM

## 2023-03-12 LAB — CBC WITH DIFFERENTIAL/PLATELET
Abs Immature Granulocytes: 0.04 10*3/uL (ref 0.00–0.07)
Basophils Absolute: 0 10*3/uL (ref 0.0–0.1)
Basophils Relative: 1 %
Eosinophils Absolute: 0.2 10*3/uL (ref 0.0–0.5)
Eosinophils Relative: 4 %
HCT: 31 % — ABNORMAL LOW (ref 39.0–52.0)
Hemoglobin: 10 g/dL — ABNORMAL LOW (ref 13.0–17.0)
Immature Granulocytes: 1 %
Lymphocytes Relative: 27 %
Lymphs Abs: 1.4 10*3/uL (ref 0.7–4.0)
MCH: 27.6 pg (ref 26.0–34.0)
MCHC: 32.3 g/dL (ref 30.0–36.0)
MCV: 85.6 fL (ref 80.0–100.0)
Monocytes Absolute: 0.6 10*3/uL (ref 0.1–1.0)
Monocytes Relative: 11 %
Neutro Abs: 3.1 10*3/uL (ref 1.7–7.7)
Neutrophils Relative %: 56 %
Platelets: 167 10*3/uL (ref 150–400)
RBC: 3.62 MIL/uL — ABNORMAL LOW (ref 4.22–5.81)
RDW: 13.3 % (ref 11.5–15.5)
WBC: 5.4 10*3/uL (ref 4.0–10.5)
nRBC: 0 % (ref 0.0–0.2)

## 2023-03-12 LAB — COMPREHENSIVE METABOLIC PANEL
ALT: 63 U/L — ABNORMAL HIGH (ref 0–44)
AST: 38 U/L (ref 15–41)
Albumin: 2.8 g/dL — ABNORMAL LOW (ref 3.5–5.0)
Alkaline Phosphatase: 50 U/L (ref 38–126)
Anion gap: 8 (ref 5–15)
BUN: 11 mg/dL (ref 6–20)
CO2: 30 mmol/L (ref 22–32)
Calcium: 8.6 mg/dL — ABNORMAL LOW (ref 8.9–10.3)
Chloride: 100 mmol/L (ref 98–111)
Creatinine, Ser: 0.83 mg/dL (ref 0.61–1.24)
GFR, Estimated: >60 mL/min (ref >60)
Glucose, Bld: 92 mg/dL (ref 70–99)
Potassium: 3.7 mmol/L (ref 3.5–5.1)
Sodium: 138 mmol/L (ref 135–145)
Total Bilirubin: 0.7 mg/dL (ref 0.3–1.2)
Total Protein: 5.7 g/dL — ABNORMAL LOW (ref 6.5–8.1)

## 2023-03-12 MED ORDER — LIDOCAINE 5 % EX PTCH
3.0000 | MEDICATED_PATCH | CUTANEOUS | Status: DC
  Administered 2023-03-12 – 2023-03-14 (×3): 3 via TRANSDERMAL
  Filled 2023-03-12 (×7): qty 3

## 2023-03-12 MED ORDER — CHLORHEXIDINE GLUCONATE CLOTH 2 % EX PADS
6.0000 | MEDICATED_PAD | Freq: Every day | CUTANEOUS | Status: DC
  Administered 2023-03-12 – 2023-03-14 (×3): 6 via TOPICAL

## 2023-03-12 MED ORDER — MORPHINE SULFATE ER 15 MG PO TBCR
15.0000 mg | EXTENDED_RELEASE_TABLET | Freq: Two times a day (BID) | ORAL | Status: DC
  Administered 2023-03-12 – 2023-04-08 (×53): 15 mg via ORAL
  Filled 2023-03-12 (×57): qty 1

## 2023-03-12 MED ORDER — BISACODYL 10 MG RE SUPP
10.0000 mg | Freq: Every day | RECTAL | Status: DC
  Administered 2023-03-12 – 2023-04-07 (×28): 10 mg via RECTAL
  Filled 2023-03-12 (×32): qty 1

## 2023-03-12 MED ORDER — ENSURE ENLIVE PO LIQD
237.0000 mL | Freq: Two times a day (BID) | ORAL | Status: DC
  Administered 2023-03-12 – 2023-04-07 (×26): 237 mL via ORAL

## 2023-03-12 MED ORDER — CHLORHEXIDINE GLUCONATE CLOTH 2 % EX PADS: 6.0000 | MEDICATED_PAD | Freq: Every day | CUTANEOUS | Status: DC

## 2023-03-12 NOTE — Plan of Care (Signed)
  Problem: RH Balance Goal: LTG: Patient will maintain dynamic sitting balance (OT) Description: LTG:  Patient will maintain dynamic sitting balance with assistance during activities of daily living (OT) Flowsheets (Taken 03/12/2023 1248) LTG: Pt will maintain dynamic sitting balance during ADLs with: Supervision/Verbal cueing   Problem: RH Grooming Goal: LTG Patient will perform grooming w/assist,cues/equip (OT) Description: LTG: Patient will perform grooming with assist, with/without cues using equipment (OT) Flowsheets (Taken 03/12/2023 1248) LTG: Pt will perform grooming with assistance level of: Independent with assistive device    Problem: RH Bathing Goal: LTG Patient will bathe all body parts with assist levels (OT) Description: LTG: Patient will bathe all body parts with assist levels (OT) Flowsheets (Taken 03/12/2023 1248) LTG: Pt will perform bathing with assistance level/cueing: Supervision/Verbal cueing   Problem: RH Dressing Goal: LTG Patient will perform upper body dressing (OT) Description: LTG Patient will perform upper body dressing with assist, with/without cues (OT). Flowsheets (Taken 03/12/2023 1248) LTG: Pt will perform upper body dressing with assistance level of: Independent with assistive device Goal: LTG Patient will perform lower body dressing w/assist (OT) Description: LTG: Patient will perform lower body dressing with assist, with/without cues in positioning using equipment (OT) Flowsheets (Taken 03/12/2023 1248) LTG: Pt will perform lower body dressing with assistance level of: Minimal Assistance - Patient > 75% Note: In long sitting with AE    Problem: RH Toileting Goal: LTG Patient will perform toileting task (3/3 steps) with assistance level (OT) Description: LTG: Patient will perform toileting task (3/3 steps) with assistance level (OT)  Flowsheets (Taken 03/12/2023 1248) LTG: Pt will perform toileting task (3/3 steps) with assistance level: Minimal  Assistance - Patient > 75% Note: Including self-cathing    Problem: RH Toilet Transfers Goal: LTG Patient will perform toilet transfers w/assist (OT) Description: LTG: Patient will perform toilet transfers with assist, with/without cues using equipment (OT) Flowsheets (Taken 03/12/2023 1248) LTG: Pt will perform toilet transfers with assistance level of: Contact Guard/Touching assist Note: DABSC or padded TTB    Problem: RH Tub/Shower Transfers Goal: LTG Patient will perform tub/shower transfers w/assist (OT) Description: LTG: Patient will perform tub/shower transfers with assist, with/without cues using equipment (OT) Flowsheets (Taken 03/12/2023 1248) LTG: Pt will perform tub/shower stall transfers with assistance level of: Contact Guard/Touching assist

## 2023-03-12 NOTE — Discharge Instructions (Addendum)
Inpatient Rehab Discharge Instructions  Lee [name] Discharge date and time: No discharge date for patient encounter.   Activities/Precautions/ Functional Status: Activity: Back brace when out of bed Diet: regular diet Wound Care: Routine skin checks Functional status:  ___ No restrictions     ___ Walk up steps independently ___ 24/7 supervision/assistance   ___ Walk up steps with assistance ___ Intermittent supervision/assistance  ___ Bathe/dress independently ___ Walk with walker     _x__ Bathe/dress with assistance ___ Walk Independently    ___ Shower independently ___ Walk with assistance    ___ Shower with assistance ___ No alcohol     ___ Return to work/school ________  Special Instructions:  No driving smoking or alcohol   COMMUNITY REFERRALS UPON DISCHARGE:    Outpatient: PT     OT             Agency: [facility]    Phone: [phone]             Appointment Date/Time: *Please expect follow-up within 7-10 business days to schedule your visit. If you have not received follow-up, be sure to contact the site directly.*  Medical Equipment/Items Ordered:transfer board and tub transfer bench                                                 Agency/Supplier:[facility] [phone]   Medical Equipment/Items Ordered:loaner wheelchair                                                 Agency/Supplier: [name] [phone]  My questions have been answered and I understand these instructions. I will adhere to these goals and the provided educational materials after my discharge from the hospital.  Patient/Caregiver Signature _______________________________ Date __________  Clinician Signature _______________________________________ Date __________  Please bring this form and your medication list with you to all your follow-up doctor's appointments.
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
## 2023-03-12 NOTE — Progress Notes (Signed)
Lower extremity venous bilateral study completed.   Please see CV Proc for preliminary results.   Donesha Wallander, RDMS, RVT  
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
## 2023-03-12 NOTE — Progress Notes (Signed)
IP Rehab Bowel Program Documentation   Bowel Program Start time [phone]   Dig Stim Indicated? Yes  Dig Stim Prior to Suppository or mini Enema X 1   Output from dig stim: none   Ordered intervention: Suppository Yes , mini enema No ,   Repeat dig stim after Suppository or Mini enema  X 1,  Output? No, only smear   Bowel Program Complete? No , handoff given yes  Repeat Dig stim x 1- bowel program now complete. Yes, completed by Irfa, RN and Pilar Grammes, RN   Patient Tolerated? Yes

## 2023-03-12 NOTE — Progress Notes (Signed)
Inpatient Rehabilitation  Patient information reviewed and entered into eRehab system by Liridona Mashaw M. Laurelle Skiver, M.A., CCC/SLP, PPS Coordinator.  Information including medical coding, functional ability and quality indicators will be reviewed and updated through discharge.    

## 2023-03-12 NOTE — Progress Notes (Signed)
Orthopedic Tech Progress Note Patient Details:  Lee [name] Jan 01, 2006 [id number]  Applied PRAFO boots bilaterally.  Ortho Devices Type of Ortho Device: Prafo boot/shoe Ortho Device/Splint Location: BLE Ortho Device/Splint Interventions: Ordered, Application, Adjustment   Post Interventions Patient Tolerated: Well Instructions Provided: Care of device, Adjustment of device, Poper ambulation with device  Blase Mess 03/12/2023, 10:06 AM
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
## 2023-03-12 NOTE — Progress Notes (Signed)
Patient ID: Lee [name], male   DOB: 2006/04/19, 16 y.o.   MRN: [id number]  SW spoke with pt mother Lee [name] [phone]) to introduce self, explain role, discuss discharge process. Pt mother confirms that pt will d/c to home with his father and support from her sister Lee [name]. States that she will be travelling back and forth from [place]. SW discussed limitations with pt being uninsured. She is unsure on status of medicaid application as it was recently completed. Per [facility] account, application submitted on 6/18. Pt mother intends to explore SSDI again, as she reports he was receiving for a medical condition prior to him working last Summer in which it stopped. SW shared will see if there is additional assistance to help with application but unsure since he had SSDI in the past, and she may have to initiate on her own. SW will follow-up. SW discussed beginning to look for DME: w/c, 3in1 BSC, and TTB. SW shared will continue to provide updates as available.   Cecile Sheerer, MSW, LCSWA Office: [phone] Cell: [phone] Fax: [phone]

## 2023-03-12 NOTE — Progress Notes (Signed)
Inpatient Rehabilitation Care Coordinator Assessment and Plan Patient Details  Name: Lee [name] MRN: [id number] Date of Birth: 2005-12-06  Today's Date: 03/12/2023  Hospital Problems: Principal Problem:   T12 spinal cord injury [facility]) Active Problems:   Complete paraplegia [facility])   Neurogenic bowel   Neurogenic bladder  Past Medical History:  Past Medical History:  Diagnosis Date   Plantar fibromatosis    Past Surgical History: No past surgical history on file. Social History:  reports that he does not drink alcohol and does not use drugs. No history on file for tobacco use.  Family / Support Systems Marital Status: Single Spouse/Significant Other: N/a Children: N/A Other Supports: various family members Anticipated Caregiver: father and aunt Myesha Ability/Limitations of Caregiver: Pt mother will be travelling back and forth from [place]. Pt will have support from his aunt and father, and various supports to check in throughout the day. Caregiver Availability: 24/7 Family Dynamics: Pt lives with his father.  Social History Preferred language: English Religion: Baptist Cultural Background: Pt was working at [employer] about 8-9 months ago. He was receiving disability for a medical condition. Mother reports that it was discontinued once he started working last summer. Education: He is assigned to [employer], and was waiting to transition to a new school. He was last in school Dec 2023. Health Literacy - How often do you need to have someone help you when you read instructions, pamphlets, or other written material from your doctor or pharmacy?: Never Writes: Yes Employment Status: Unemployed Date Retired/Disabled/Unemployed: August 2023 Legal History/Current Legal Issues: Pt admits that he has an assigned Warden/ranger- [employer]. They are unaware on when he will be discharged. Reports they receive phone calls when they need to show up for  court. Guardian/Conservator: N/A. Pt allows his mother to make medical decisions.   Abuse/Neglect Abuse/Neglect Assessment Can Be Completed: Yes Physical Abuse: Denies Verbal Abuse: Denies Sexual Abuse: Denies Exploitation of patient/patient's resources: Denies Self-Neglect: Denies  Patient response to: Social Isolation - How often do you feel lonely or isolated from those around you?: Never  Emotional Status Pt's affect, behavior and adjustment status: Pt in good spirits at time of visit as surrounded by his family. Family is very supportive in his desire to walk to allow him to keep his drive. Recent Psychosocial Issues: Denies Psychiatric History: Denies. Pt has been being seen by psychiatry while in hospital. Pt scheduled to see Dr. Kieth Brightly. Substance Abuse History: Pt admits to daily marijuana use PTA. Last use was 6/18.  Patient / Family Perceptions, Expectations & Goals Pt/Family understanding of illness & functional limitations: Pt and family have a general understanding of pt care needs Premorbid pt/family roles/activities: Independent Anticipated changes in roles/activities/participation: Assistance with ADLs/IADLs Pt/family expectations/goals: Pt goal is to walk.  Community Resources [employer]: None Premorbid Home Care/DME Agencies: None Transportation available at discharge: TBD Is the patient able to respond to transportation needs?: Yes In the past 12 months, has lack of transportation kept you from medical appointments or from getting medications?: No In the past 12 months, has lack of transportation kept you from meetings, work, or from getting things needed for daily living?: No Resource referrals recommended: Neuropsychology  Discharge Planning Living Arrangements: Parent Support Systems: Parent, Other relatives Type of Residence: Private residence Insurance Resources: Customer service manager Resources: Family Support Financial Screen Referred:  Previously completed Living Expenses: Lives with family Money Management: Family Does the patient have any problems obtaining your medications?: No Home Management:  Pt helped manage home care needs with his father. Patient/Family Preliminary Plans: TBD Care Coordinator Barriers to Discharge: Insurance for SNF coverage, Home environment access/layout Care Coordinator Anticipated Follow Up Needs: HH/OP  Clinical Impression SW met with pt, pt mother, pt grandmother and aunt in room. Pt is not a Cytogeneticist. No DME. Pt is eager to walk again, and remains motivated to make progress here in rehab.   Gretchen Short 03/12/2023, 4:28 PM

## 2023-03-12 NOTE — Plan of Care (Signed)
  Problem: RH Balance Goal: LTG Patient will maintain dynamic sitting balance (PT) Description: LTG:  Patient will maintain dynamic sitting balance with assistance during mobility activities (PT) Flowsheets (Taken 03/12/2023 1253) LTG: Pt will maintain dynamic sitting balance during mobility activities with:: Independent   Problem: RH Bed Mobility Goal: LTG Patient will perform bed mobility with assist (PT) Description: LTG: Patient will perform bed mobility with assistance, with/without cues (PT). Flowsheets (Taken 03/12/2023 1253) LTG: Pt will perform bed mobility with assistance level of: Independent   Problem: RH Bed to Chair Transfers Goal: LTG Patient will perform bed/chair transfers w/assist (PT) Description: LTG: Patient will perform bed to chair transfers with assistance (PT). Flowsheets (Taken 03/12/2023 1253) LTG: Pt will perform Bed to Chair Transfers with assistance level: Independent with assistive device  Note: With LRAD   Problem: RH Car Transfers Goal: LTG Patient will perform car transfers with assist (PT) Description: LTG: Patient will perform car transfers with assistance (PT). Flowsheets (Taken 03/12/2023 1253) LTG: Pt will perform car transfers with assist:: Set up assist  Note: With LRAD   Problem: RH Wheelchair Mobility Goal: LTG Patient will propel w/c in controlled environment (PT) Description: LTG: Patient will propel wheelchair in controlled environment, # of feet with assist (PT) Flowsheets (Taken 03/12/2023 1253) LTG: Pt will propel w/c in controlled environ  assist needed:: Independent with assistive device LTG: Propel w/c distance in controlled environment: 500' Goal: LTG Patient will propel w/c in home environment (PT) Description: LTG: Patient will propel wheelchair in home environment, # of feet with assistance (PT). Flowsheets (Taken 03/12/2023 1253) LTG: Pt will propel w/c in home environ  assist needed:: Independent with assistive device Distance:  wheelchair distance in controlled environment: 50 Note: Navigating obstacles, thresholds and doorways   Problem: RH Pre-functional/Other (Specify) Goal: RH LTG PT (Specify) 1 Description:  RH LTG PT (Specify) 1 Flowsheets (Taken 03/12/2023 1253) LTG: Other PT (Specify) 1: Pt will tolerate standing in standing frame up to 5 minutes as tolerance to standing upright as well as to decrease fracture risk with weightbearing to BLEs
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
## 2023-03-13 NOTE — Progress Notes (Signed)
IP Rehab Bowel Program Documentation   Bowel Program Start time [phone]   Dig Stim Indicated? Yes  Dig Stim Prior to Suppository or mini Enema X 1   Output from dig stim: none   Ordered intervention: Suppository Yes , mini enema No ,   Repeat dig stim after Suppository or Mini enema  X 1,  Output?  No  Bowel Program Complete? No , handoff given yes to night shift  Repeat Dig. Stim x1  Bowel program now complete; No Output. Pilar Grammes, RN   Patient Tolerated? Yes

## 2023-03-14 MED ORDER — PSYLLIUM 95 % PO PACK
1.0000 | PACK | Freq: Every day | ORAL | Status: DC
  Administered 2023-03-15 – 2023-04-08 (×6): 1 via ORAL
  Filled 2023-03-14 (×15): qty 1

## 2023-03-14 MED ORDER — CALCIUM CARBONATE ANTACID 500 MG PO CHEW
1.0000 | CHEWABLE_TABLET | Freq: Every day | ORAL | Status: DC
  Administered 2023-03-14 – 2023-03-17 (×4): 200 mg via ORAL
  Filled 2023-03-14 (×4): qty 1

## 2023-03-14 NOTE — Progress Notes (Signed)
Physical Therapy Session Note  Patient Details  Name: Lee [name] MRN: [id number] Date of Birth: [birth year]  Today's Date: 03/14/2023 PT Individual Time: 0802-0916 PT Individual Time Calculation (min): 74 min   Short Term Goals: Week 1:  PT Short Term Goal 1 (Week 1): Pt will complete bed mobility with max assist of 1 person PT Short Term Goal 2 (Week 1): Pt will complete slideboard transfer with mod assist of 1 person PT Short Term Goal 3 (Week 1): Pt will tolerate sitting upright in wheelchair for 4 hours outside of therapy PT Short Term Goal 4 (Week 1): Pt will correctly verbalize frequency and type of seated pressure relief to decrease risk for pressure injuries PT Short Term Goal 5 (Week 1): Pt will manage all WC parts as well as BLEs on/off leg rests with supervision  Skilled Therapeutic Interventions/Progress Updates: Pt presents supine in bed and agreeable to therapy, wanting to get OOB.  Pt does state pain of 10/10 and meds given during session as well as lidoderm patches to back and R sided abdomen.  Pt required total A for threading feet and catheter through shorts.  Pt rolled side to side w/  mod A and positioning knees in hooklying for total A to pull shorts over hips, place lidoderm patches and don TLSO.  Pt able to maintain sidelying position.  Pt donned pull over shirt in supine w/ completion in sidelying.  Total A for donning shoes.  Pt performed log roll technique for sidelying to sit w/ Total A for LES and then pt assists using siderails.  Pt required CGA to mod A to maintain seated balance at EOB.  Pt required max A of 1 for sidebending R and to lift LLE, but pt positioning SB w/ min A from +2.  Pt able to maintain forward balance during transfer.  PT w/ min blocking of B knees and mod A for sequential scoot across SB, w/ re-positioning of LES, +2 for CGA of trunk.  Pt negotiated w/c in front of sink, performing mouth hygiene.  Pt negotiated w/c w/ supervision out of room through  crowded hallways , verbal cues for increased propulsion.  Pt returned to room.  Pt performed SB transfer w/ mod A .  Shoes doffed at EOB.  Pt transferred sit to sidelying, then supine w/ mod A for LES.  Pt positioned w/ pillows B sides of trunk for comfort, heels elevated on pillows.  Bed alarm on and all needs in reach.     Therapy Documentation Precautions:  Precautions Precautions: Fall, Other (comment), Back Precaution Comments: paraplegia, chest tube Required Braces or Orthoses: Spinal Brace Spinal Brace: Thoracolumbosacral orthotic, Applied in supine position Restrictions Weight Bearing Restrictions: No General:   Vital Signs:   Pain:10/10 Pain Assessment Pain Scale: 0-10 Pain Score: 10-Worst pain ever Pain Type: Acute pain Pain Location: Back Pain Orientation: Lower Pain Descriptors / Indicators: Aching;Discomfort Pain Frequency: Constant Pain Onset: Gradual Pain Intervention(s): Medication (See eMAR)      Therapy/Group: Individual Therapy  Lucio Edward 03/14/2023, 10:48 AM
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
## 2023-03-14 NOTE — Progress Notes (Signed)
Physical Therapy Session Note  Patient Details  Name: Lee [name] MRN: [id number] Date of Birth: [birth year]  Today's Date: 03/14/2023 PT Individual Time: 1306-1420 PT Individual Time Calculation (min): 74 min   Short Term Goals: Week 1:  PT Short Term Goal 1 (Week 1): Pt will complete bed mobility with max assist of 1 person PT Short Term Goal 2 (Week 1): Pt will complete slideboard transfer with mod assist of 1 person PT Short Term Goal 3 (Week 1): Pt will tolerate sitting upright in wheelchair for 4 hours outside of therapy PT Short Term Goal 4 (Week 1): Pt will correctly verbalize frequency and type of seated pressure relief to decrease risk for pressure injuries PT Short Term Goal 5 (Week 1): Pt will manage all WC parts as well as BLEs on/off leg rests with supervision  Skilled Therapeutic Interventions/Progress Updates: Pt presented in bed agreeable to therapy. PTA met with grandmother outside room prior to entering with her providing info regarding possible PTSD (advised very likely) and that family is in discussion regarding after CIR to transfer up to [facility] to go to [facility] for further rehab. Pt indicated pain 9/10, RN notified and received pain meds prior to OOB mobility. Pt performed rolling L/R with minA for BLE management to don brace. Performed supine to sit with maxA (+2 present for safety) and pt scooted anteriorly towards HOB with minA. Pt able to lean to R to allow placement of Slide board. With increased time performed Slide board transfer to L with modA and cues for improved head hips relationship. Pt then propelled to day room with supervision and able to lock/unlock breaks with verbal cues only. Performed Slide board transfer to R with modA to high/low mat. At [facility] provided education on use of leg loops and demonstrating however advised will not implement today to work on core strengthening. Pt then participated in sitting balance activities with pt working on bringing  hands forwards vs propping self up with hands posteriorly. Pt also worked on dynamic sitting balance catching small basketball with one hand with PTA slowly increasing lateral challenges. Pt was able to progress to catching then throwing to basketball net. Pt required intermittent breaks to regain positioning and maintain balance. Pt required overall minA for more moderate challenges with pt able to use BUE for protective responses. Pt then ultimately worked on catching with BUE and tossing to basketball net. Pt was able to tolerate this activity for ~75min with intermittent breaks. While performing activity pt indicated no increase in pain while performing activity but prior to transferring back to w/c indicated increased pain in back from unsupported sitting. Pt required maxA for Slide board set up and performed transfer to L back to w/c with minA. Pt propelled back to room in same manner as prior and requesting to remain in w/c to go outside with family (has grounds pass). Pt left in w/c with family present and current needs met.      Therapy Documentation Precautions:  Precautions Precautions: Fall, Other (comment), Back Precaution Comments: paraplegia, chest tube Required Braces or Orthoses: Spinal Brace Spinal Brace: Thoracolumbosacral orthotic, Applied in supine position Restrictions Weight Bearing Restrictions: No General:   Vital Signs: Therapy Vitals Temp: 98.7 F (37.1 C) Pulse Rate: 89 Resp: 17 BP: 135/82 Patient Position (if appropriate): Sitting Oxygen Therapy SpO2: 93 % O2 Device: Room Air Pain: Pain Assessment Pain Scale: 0-10 Pain Score: 9  Pain Type: Acute pain Pain Location: Neck Pain Orientation: Posterior Pain Descriptors / Indicators: Aching  Pain Frequency: Intermittent Pain Onset: Progressive Pain Intervention(s): Medication (See eMAR) Mobility:   Locomotion :    Trunk/Postural Assessment :    Balance:   Exercises:   Other Treatments:       Therapy/Group: Individual Therapy  Chirag Krueger 03/14/2023, 4:19 PM

## 2023-03-14 NOTE — IPOC Note (Signed)
Overall Plan of Care [facility]) Patient Details Name: Lee [name] MRN: [id number] DOB: 2006-09-09  Admitting Diagnosis: T12 spinal cord injury [facility])  Hospital Problems: Principal Problem:   T12 spinal cord injury (HCC) Active Problems:   Complete paraplegia (HCC)   Neurogenic bowel   Neurogenic bladder     Functional Problem List: Nursing Safety, Bladder, Bowel, Sensory, Skin Integrity, Edema, Endurance, Medication Management, Motor, Pain  PT Balance, Sensory, Motor, Pain, Endurance, Safety  OT Balance, Endurance, Behavior, Sensory, Edema, Pain, Skin Integrity  SLP    TR         Basic ADL's: OT Grooming, Toileting, Bathing, Dressing     Advanced  ADL's: OT Simple Meal Preparation, Laundry     Transfers: PT Bed to Chair, Bed Mobility, Car  OT Toilet, Research scientist (life sciences): PT Wheelchair Mobility     Additional Impairments: OT None  SLP        TR      Anticipated Outcomes Item Anticipated Outcome  Self Feeding indep  Swallowing      Basic self-care  mod I UB self care, min-mod A LB self care  Toileting  directing b& B if not self cathing   Bathroom Transfers CGA  Bowel/Bladder  manage bowel and bladder independently  Transfers  modI via slideboard/lateral scoot  Locomotion  modI via WC  Communication     Cognition     Pain  less than 4  Safety/Judgment  no falls   Therapy Plan: PT Intensity: Minimum of 1-2 x/day ,45 to 90 minutes PT Frequency: 5 out of 7 days PT Duration Estimated Length of Stay: 3-4 weeks OT Intensity: Minimum of 1-2 x/day, 45 to 90 minutes OT Frequency: 5 out of 7 days OT Duration/Estimated Length of Stay: 3-4 weeks     Team Interventions: Nursing Interventions Patient/Family Education, Medication Management, Psychosocial Support, Bladder Management, Bowel Management, Skin Care/Wound Management, Pain Management, Discharge Planning  PT interventions Balance/vestibular training, DME/adaptive equipment instruction,  Neuromuscular re-education, Stair training, UE/LE Strength taining/ROM, Wheelchair propulsion/positioning, Functional electrical stimulation, Pain management, Discharge planning, Community reintegration, Psychosocial support, Therapeutic Activities, UE/LE Coordination activities, Disease management/prevention, Functional mobility training, Patient/family education, Splinting/orthotics, Therapeutic Exercise, Ambulation/gait training  OT Interventions Warden/ranger, Cognitive remediation/compensation, Community reintegration, Discharge planning, Disease mangement/prevention, DME/adaptive equipment instruction, Functional electrical stimulation, Functional mobility training, Neuromuscular re-education, Pain management, Patient/family education, Psychosocial support, Self Care/advanced ADL retraining, Skin care/wound managment, Splinting/orthotics, Therapeutic Activities, Therapeutic Exercise, UE/LE Strength taining/ROM, UE/LE Coordination activities, Wheelchair propulsion/positioning  SLP Interventions    TR Interventions    SW/CM Interventions Discharge Planning, Psychosocial Support, Patient/Family Education   Barriers to Discharge MD  Medical stability  Nursing Decreased caregiver support, Home environment access/layout, Neurogenic Bowel & Bladder, Wound Care, Lack of/limited family support, Insurance for SNF coverage, Weight bearing restrictions, Medication compliance patient will be going home with dad or aunt  PT Home environment access/layout, Inaccessible home environment inconsistency in discharge location however could possibly discharge to aunt's house that has level entry and full bedroom/bath on first floor. Unsure about accessibility of bathroom from [facility] level  OT Inaccessible home environment, Decreased caregiver support, Home environment access/layout, Neurogenic Bowel & Bladder, Incontinence, Wound Care new para, apt with dad and family but accessibility may be limited  SLP       SW Insurance for SNF coverage, Home environment access/layout     Team Discharge Planning: Destination: PT-Home ,OT- Home , SLP-  Projected Follow-up: PT-Outpatient PT, OT-  Outpatient OT, SLP-  Projected Equipment  Needs: PT-Sliding board, OT- 3 in 1 bedside comode, Tub/shower bench, Wheelchair (measurements), Wheelchair cushion (measurements), SLP-  Equipment Details: PT-custom WC, OT-may need padded TTB, DABSC, TB, custom seating, hospital bed Patient/family involved in discharge planning: PT- Patient, Family member/caregiver,  OT-Patient, Family member/caregiver, SLP-   MD ELOS: 24-28 days Medical Rehab Prognosis:  Excellent Assessment: The patient has been admitted for CIR therapies with the diagnosis of  complete paraplegia. The team will be addressing functional mobility, strength, stamina, balance, safety, adaptive techniques and equipment, self-care, bowel and bladder mgt, patient and caregiver education. Goals have been set at [facility]. Anticipated discharge destination is home.        See Team Conference Notes for weekly updates to the plan of care

## 2023-03-15 NOTE — Progress Notes (Signed)
Patient ID: Lee [name], male   DOB: 10-14-2005, 17 y.o.   MRN: [id number]  SW met with pt and pt mother in room to discuss letter needed for employer. Will explore if FMLA forms are needed. SW informed attending on letter needed.   Cecile Sheerer, MSW, LCSWA Office: [phone] Cell: [phone] Fax: [phone]

## 2023-03-15 NOTE — Progress Notes (Signed)
Educated patient and mother on purpose of prafo boots.  Prafo boots applied per MD order.    Tilden Dome, LPN
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
## 2023-03-15 NOTE — Progress Notes (Signed)
Physical Therapy Session Note  Patient Details  Name: Lee [name] MRN: [id number] Date of Birth: 2006-08-23  Today's Date: 03/15/2023 PT Individual Time: 0815-0915 PT Individual Time Calculation (min): 60 min   Short Term Goals: Week 1:  PT Short Term Goal 1 (Week 1): Pt will complete bed mobility with max assist of 1 person PT Short Term Goal 2 (Week 1): Pt will complete slideboard transfer with mod assist of 1 person PT Short Term Goal 3 (Week 1): Pt will tolerate sitting upright in wheelchair for 4 hours outside of therapy PT Short Term Goal 4 (Week 1): Pt will correctly verbalize frequency and type of seated pressure relief to decrease risk for pressure injuries PT Short Term Goal 5 (Week 1): Pt will manage all WC parts as well as BLEs on/off leg rests with supervision  Skilled Therapeutic Interventions/Progress Updates:    pt received in bed and requesting several minutes for his SO to get dressed. Pt missed x15 minutes, will make up as able.  Pt dressed with max A. Able to participate in rolling with min A and direct care for dressing tasks. Donned brace in supine. Supine >to sit with tot A for BLE management and mod A for trunk management. Pt able to maintain sitting balance with BUE support behind him. slideboard transfer x 2 with mod A overall, max a for board placement and mod for transfer. Cueing for increased hip clearance to prevent pressure injury. Pt brushed teeth at w/c level with set up A . Pt propelled w/c with BUE to/from day room. Cues for more efficient push stroke. Pt  set up with leg loops and instructed in donning and use. Pt was able to use successfully during transfer back to bed. Pt returned to supine with max A and was able to reposition in bed with cueing to use bed rails. Pt was left with all needs in reach and alarm active.   Therapy Documentation Precautions:  Precautions Precautions: Fall, Other (comment), Back Precaution Comments: paraplegia, chest  tube Required Braces or Orthoses: Spinal Brace Spinal Brace: Thoracolumbosacral orthotic, Applied in supine position Restrictions Weight Bearing Restrictions: No General: PT Amount of Missed Time (min): 15 Minutes PT Missed Treatment Reason: Patient unwilling to participate     Therapy/Group: Individual Therapy  [name] 03/15/2023, 12:51 PM

## 2023-03-15 NOTE — Progress Notes (Signed)
Occupational Therapy Session Note  Patient Details  Name: Lee [name] MRN: [id number] Date of Birth: [birth year]  Today's Date: 03/15/2023 OT Individual Time: 1400-1500 OT Individual Time Calculation (min): 60 min    Short Term Goals: Week 1:  OT Short Term Goal 1 (Week 1): Pt will perform long sit mat level with B UE support in prep for LB dressing with CGA OT Short Term Goal 2 (Week 1): Pt will transfer to most appropriate shower seating DME with TB and mod A OT Short Term Goal 3 (Week 1): Pt will sit EOB with CGA for BADL's with min A  Skilled Therapeutic Interventions/Progress Updates:   Pt seen for  skilled OT pm session with pt eager for shower and GF [name] asking to start caregiver educ relating to adaptive bathing techniques, skin protection, spinal precautions. Foley out and no spontaneous voiding reported. Urinal placed bedside if in case urge prior to cathing. OT and + 2 assist covered all open areas for waterproofing, log rolls to R side with mod-min A with rails then supine to EOB with max A, UE supported EOB with mod A, SB transfer on and off rolling shower chair with 2 helpers and Total fading to max assist. UB bathing rolling shower level with mod fading to min A, LB.  Once back to bed, total A for thigh high TEDS and shorts repositioned and left with nurse tech and GF bedside with all needs, nurse call button and bed exit set.    Pain: 6/10 in back with reduction of pain to 2/10 once back to bed with warm shower, pain meds admin by nursing, and repositioning   Therapy Documentation Precautions:  Precautions Precautions: Fall, Other (comment), Back Precaution Comments: paraplegia, chest tube Required Braces or Orthoses: Spinal Brace Spinal Brace: Thoracolumbosacral orthotic, Applied in supine position Restrictions Weight Bearing Restrictions: No   Therapy/Group: Individual Therapy  [name] 03/15/2023, 7:34 AM

## 2023-03-15 NOTE — Progress Notes (Signed)
[facility]patient [facility] Individual Statement of Services  Patient Name:  Lee [name]  Date:  03/15/2023  Welcome to the Inpatient Rehabilitation Center.  Our goal is to provide you with an individualized program based on your diagnosis and situation, designed to meet your specific needs.  With this comprehensive rehabilitation program, you will be expected to participate in at least 3 hours of rehabilitation therapies Monday-Friday, with modified therapy programming on the weekends.  Your rehabilitation program will include the following services:  Physical Therapy (PT), Occupational Therapy (OT), 24 hour per day rehabilitation nursing, Therapeutic Recreaction (TR), Psychology, Neuropsychology, Care Coordinator, Rehabilitation Medicine, Nutrition Services, Pharmacy Services, and Other  Weekly team conferences will be held on Tuesdays to discuss your progress.  Your Inpatient Rehabilitation Care Coordinator will talk with you frequently to get your input and to update you on team discussions.  Team conferences with you and your family in attendance may also be held.  Expected length of stay: 3-4 weeks    Overall anticipated outcome: Independent with Assistive Device  Depending on your progress and recovery, your program may change. Your Inpatient Rehabilitation Care Coordinator will coordinate services and will keep you informed of any changes. Your Inpatient Rehabilitation Care Coordinator's name and contact numbers are listed  below.  The following services may also be recommended but are not provided by the Inpatient Rehabilitation Center:  Driving Evaluations Home Health Rehabiltiation Services Outpatient Rehabilitation Services Vocational Rehabilitation   Arrangements will be made to provide these services after discharge if needed.  Arrangements include referral to agencies that provide these services.  Your insurance has been verified to be:  Medicaid pending  Your primary  doctor is:  No PCP listed  Pertinent information will be shared with your doctor and your insurance company.  Inpatient Rehabilitation Care Coordinator:  Susie Cassette [phone] or ([phone]  Information discussed with and copy given to patient by: Gretchen Short, 03/15/2023, 10:13 AM

## 2023-03-15 NOTE — Progress Notes (Signed)
Prafo boots reapplied when put back in bed.     Tilden Dome, LPN

## 2023-03-15 NOTE — Progress Notes (Signed)
IP Rehab Bowel Program Documentation    Bowel Program Start time 1800   Dig Stim Prior: Yes,  per MD order   Output from dig stim: None  Ordered intervention: Suppository Yes   Repeat dig stim after Suppository per Md order  Output? No  Bowel Program Complete? Yes ,  handoff given to [facility]  Patient Tolerated? Yes   No output at this time, notified oncoming nurse.     Zebulon Gantt Del Overfelt,LPN

## 2023-03-15 NOTE — Progress Notes (Signed)
Occupational Therapy Session Note  Patient Details  Name: Lee [name] MRN: [id number] Date of Birth: 03-Jun-2006  Today's Date: 03/15/2023 OT Individual Time: 1100-1200 OT Individual Time Calculation (min): 60 min  and Today's Date: 03/15/2023 OT Missed Time: 15 Minutes Missed Time Reason: Nursing care   Short Term Goals: Week 1:  OT Short Term Goal 1 (Week 1): Pt will perform long sit mat level with B UE support in prep for LB dressing with CGA OT Short Term Goal 2 (Week 1): Pt will transfer to most appropriate shower seating DME with TB and mod A OT Short Term Goal 3 (Week 1): Pt will sit EOB with CGA for BADL's with min A  Skilled Therapeutic Interventions/Progress Updates:    Pt resting in bed upon arrival with mother present. Pt initially agreeable to getting OOB. Pt found to be incontinent of bowel. Pt requested that a male staff member assist with hygiene. Pt also requested that male therapists assist with BADLs. Focus on discharge planning, education, LTGs, and purpose of therapy. Discussed prognosis. Explained complete SCI vs incomplete SCI. Discussed DME requirements. Home measurement sheet provided for Mom. Pt remained in bed with all needs within reach.   Therapy Documentation Precautions:  Precautions Precautions: Fall, Other (comment), Back Precaution Comments: paraplegia, chest tube Required Braces or Orthoses: Spinal Brace Spinal Brace: Thoracolumbosacral orthotic, Applied in supine position Restrictions Weight Bearing Restrictions: No General: General OT Amount of Missed Time: 15 Minutes Pain: Pt reports 4/10 back pain; meds admin prior to therapy  Therapy/Group: Individual Therapy  Rich Brave 03/15/2023, 12:15 PM

## 2023-03-16 DIAGNOSIS — F4321 Adjustment disorder with depressed mood: Secondary | ICD-10-CM

## 2023-03-16 MED ORDER — SORBITOL 70 % SOLN
45.0000 mL | Freq: Once | Status: AC
  Administered 2023-03-16: 45 mL via ORAL
  Filled 2023-03-16: qty 60

## 2023-03-16 NOTE — Progress Notes (Signed)
Initiated education on I/O cath and bowel prorgram. Educated  on hand hygiene.   Tilden Dome, LPN

## 2023-03-16 NOTE — Progress Notes (Signed)
Educated patient and mother on purpose of I/O Cath and bowel program. Patient provided with Medical illustrator cath demonstration.     Tilden Dome, LPN

## 2023-03-16 NOTE — Progress Notes (Signed)
Prafo boots remain on patient at this time.     Tilden Dome, LPN

## 2023-03-16 NOTE — Progress Notes (Signed)
Patient ID: Lee [name], male   DOB: 09/07/2006, 17 y.o.   MRN: [id number]  SW met with pt and pt mother to provide updates from team conference, and d/c date 7/18. SW discussed family meeting as suggested by medical team. Pt mother will be leaving later on today or tomorrow, however, available by phone. SW informed family meeting will be held in the room, and will call her along with any other family members. SW encouraged to have family present as well if possible.   Cecile Sheerer, MSW, LCSWA Office: [phone] Cell: [phone] Fax: [phone]

## 2023-03-16 NOTE — Progress Notes (Signed)
Physical Therapy Session Note  Patient Details  Name: Lee [name] MRN: [id number] Date of Birth: 2006/08/09  Today's Date: 03/16/2023 PT Individual Time: 0800-0900, 1130-1200 PT Individual Time Calculation (min): 60 min, 30 min   Short Term Goals: Week 1:  PT Short Term Goal 1 (Week 1): Pt will complete bed mobility with max assist of 1 person PT Short Term Goal 2 (Week 1): Pt will complete slideboard transfer with mod assist of 1 person PT Short Term Goal 3 (Week 1): Pt will tolerate sitting upright in wheelchair for 4 hours outside of therapy PT Short Term Goal 4 (Week 1): Pt will correctly verbalize frequency and type of seated pressure relief to decrease risk for pressure injuries PT Short Term Goal 5 (Week 1): Pt will manage all WC parts as well as BLEs on/off leg rests with supervision  Skilled Therapeutic Interventions/Progress Updates:    Session 1: Pt asleep in bed on arrival but easily roused and agreeable to therapy. Pt reports unrated pain, premedicated. Rest and positioning provided as needed. Donned ted hose and shoes dependently. Bed mobility with min to roll, HHA to initiate trunk elevation, and CGA to complete.  slideboard transfer with mod A, working on placing board with assist only to lift LE. Cues for positioning board and anterior weight shift for successful transfer. Pt brushed teeth at w/c level, cues for increased independence (leaning forward in chair to spit, etc.). During this time, therapist adjusted w/c brakes for improved safety. Rest of session focused on pressure relief strategies. Pt performed 3 x 1 min w/c push up with cues for shoulder depression. Pt able to nearly fully clear bottom, but will benefit from further training for improved efficiency of pressure relief. Reinforced education on length of time and frequency of pressure relief when sitting in chair. Pt returned to bed with slideboard transfer in same manner as above. Left in care of nsg for cathing at  end of session.   Session 2: Pt recd in bed as hand off from nsg. Intermittent pain, rest and positioning as needed. Pt's mother present, provided education throughout. Bed mobility with min to roll, HHA to initiate trunk elevation, and CGA to complete. Pt propelled w/c with BUE for endurance and functional mobility. Adjusted w/c back rest with assist from ATP for improved posture and push mechanics.   Pt performed slideboard transfer x 1 and squat pivot R and L. Pt able to assist with placing board, mod A. Mod A squat pivot, with cues for anterior weight shift and set up for improved technique. When returning to w/c, pt with greatly improved lift, fully clearing buttocks for first time with this therapist.   Pt returned to room, agreeable to staying up in chair for improved upright tolerance and to assess comfort of chair. Pt was left with all needs in reach and his mother present.   Therapy Documentation Precautions:  Precautions Precautions: Fall, Other (comment), Back Precaution Comments: paraplegia, chest tube Required Braces or Orthoses: Spinal Brace Spinal Brace: Thoracolumbosacral orthotic, Applied in supine position Restrictions Weight Bearing Restrictions: No General:       Therapy/Group: Individual Therapy  [name] 03/16/2023, 12:21 PM
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
## 2023-03-16 NOTE — Progress Notes (Signed)
Physical Therapy Session Note  Patient Details  Name: Lee [name] MRN: [id number] Date of Birth: 30-Dec-2005  Today's Date: 03/16/2023 PT Individual Time: 1330-1437 PT Individual Time Calculation (min): 67 min   Short Term Goals: Week 1:  PT Short Term Goal 1 (Week 1): Pt will complete bed mobility with max assist of 1 person PT Short Term Goal 2 (Week 1): Pt will complete slideboard transfer with mod assist of 1 person PT Short Term Goal 3 (Week 1): Pt will tolerate sitting upright in wheelchair for 4 hours outside of therapy PT Short Term Goal 4 (Week 1): Pt will correctly verbalize frequency and type of seated pressure relief to decrease risk for pressure injuries PT Short Term Goal 5 (Week 1): Pt will manage all WC parts as well as BLEs on/off leg rests with supervision  Skilled Therapeutic Interventions/Progress Updates:      Therapy Documentation Precautions:  Precautions Precautions: Fall, Other (comment), Back Precaution Comments: paraplegia, chest tube Required Braces or Orthoses: Spinal Brace Spinal Brace: Thoracolumbosacral orthotic, Applied in supine position Restrictions Weight Bearing Restrictions: No   Pt received seated in utralight weight manual w/c and pt reports able to sit up in w/c throughout lunch, demonstrating improved upright tolerance. With fatigue pt presents with right lateral lean in w/c due to fatigue, will update primary PT. Pt (S) for w/c mobility and able to demonstrate opening hospital room door from w/c level. In therapy gym pt requires supervision for leg rest removal and slide board placement and min A for slide board transfer to gym. Pt CGA/min A for static sitting balance without UE support. Pt reports increased stomach pain and requested to return to room to attempt bowel movement. Pt wanted to trial lateral transfer pop over without slide board and requires mod A . Pt dependently transported for time to room and max A for lateral transfer to  padded BSC. PT positioned 4 inch step underneath pt to improve alignment to facilitate bowel movement. Pt with mucous discharge and pt notified nursing. Pt returned to bed max A and left semi-reclined in bed with all needs in reach and alarm on.    Therapy/Group: Individual Therapy  Truitt Leep Truitt Leep PT, DPT  03/16/2023, 6:04 AM

## 2023-03-16 NOTE — Progress Notes (Signed)
Occupational Therapy Session Note  Patient Details  Name: Lee [name] MRN: [id number] Date of Birth: [birth year]  Today's Date: 03/16/2023 OT Individual Time: 1027-2536 OT Individual Time Calculation (min): 60 min    Short Term Goals: Week 1:  OT Short Term Goal 1 (Week 1): Pt will perform long sit mat level with B UE support in prep for LB dressing with CGA OT Short Term Goal 2 (Week 1): Pt will transfer to most appropriate shower seating DME with TB and mod A OT Short Term Goal 3 (Week 1): Pt will sit EOB with CGA for BADL's with min A  Skilled Therapeutic Interventions/Progress Updates:   Grandma and mom present for session. Pt in bed asleep in supine upon OT arrival. OT encouraging pt to awaken and family did as well. Pt educated on need for becoming consistent with a toileting routine and how therapy can help. Supine to sit from side lying with max A with bed features and rails. Pt on and off padded tub transfer bench for bowel program (unofficial as it was not coordinated with nursing but for educ purposes for use of DME, TB and demo intro of digistim/supp inserter). Pt had mucous BM. Needed total fading to max A x 1 and min A for board mngt x 1. Solid depression lifts for brief and board mngt with trunk improving. Sit to supine with max x1, min a x 1, Left pt with NT's to complete incontinence brief donning and completion of LB routine due to time mngt.   Pain: un-rated back pain when seated on padded TTB with repositioning for relief.   Therapy Documentation Precautions:  Precautions Precautions: Fall, Other (comment), Back Precaution Comments: paraplegia, chest tube Required Braces or Orthoses: Spinal Brace Spinal Brace: Thoracolumbosacral orthotic, Applied in supine position Restrictions Weight Bearing Restrictions: No    Therapy/Group: Individual Therapy  [name] 03/16/2023, 7:57 AM
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
## 2023-03-16 NOTE — Progress Notes (Signed)
IP Rehab Bowel Program Documentation   Bowel Program Start time 1800  Dig Stim Indicated? Yes  Dig Stim Prior to Suppository; Yes, per Md order   Output from dig stim: None  Ordered intervention: Suppository Yes ,  mini enema No ,   Repeat dig stim after Suppository: Yes, per MD order  Output? None   Bowel Program Complete? Yes ,   handoff given Alona Bene, LPN  Patient Tolerated? Yes    Lee Dome, LPN

## 2023-03-17 ENCOUNTER — Inpatient Hospital Stay (HOSPITAL_COMMUNITY): Payer: Medicaid Other

## 2023-03-17 MED ORDER — LIDOCAINE HCL URETHRAL/MUCOSAL 2 % EX GEL
1.0000 | Freq: Once | CUTANEOUS | Status: AC
  Administered 2023-03-17: 1 via URETHRAL
  Filled 2023-03-17: qty 6

## 2023-03-17 MED ORDER — CHLORHEXIDINE GLUCONATE CLOTH 2 % EX PADS
6.0000 | MEDICATED_PAD | Freq: Every day | CUTANEOUS | Status: DC
  Administered 2023-03-18 – 2023-04-01 (×6): 6 via TOPICAL

## 2023-03-17 MED ORDER — SORBITOL 70 % SOLN
45.0000 mL | Freq: Once | Status: AC
  Administered 2023-03-17: 45 mL via ORAL
  Filled 2023-03-17: qty 60

## 2023-03-17 NOTE — Progress Notes (Signed)
IP Rehab Bowel Program Documentation   Bowel Program Start time 1800  Dig Stim Indicated? Yes  Dig Stim Prior to Suppository or mini Enema X 1   Output from dig stim: Minimal  Ordered intervention: Suppository Yes , mini enema No ,   Repeat dig stim after Suppository or Mini enema  X 1,  Output? Minimal   Bowel Program Complete? Yes , handoff given to this LPN from previous shift  Patient Tolerated? Yes

## 2023-03-17 NOTE — Progress Notes (Signed)
Provided education on I/O cath; patient provided verbal teach back. While guiding patient during demonstration, noted blood return and resistance (silicone I/O cath for home use). Removed catheter. Notified Deatra Ina, PA of blood return. Verbal order received to place foley catheter. Placed latex catheter; patient tolerated well, no blood returned noted. Yellow clear urine noted to drainage bag.    Tilden Dome, LPN

## 2023-03-17 NOTE — Progress Notes (Addendum)
Patient ID: Lee [name], male   DOB: May 24, 2006, 17 y.o.   MRN: [id number]  SW emailed letter to pt mother [name] for employer. SW left original letter in room.   Pt scheduled for hospital follow-up/new patient appt with [facility] on Wednesday, July 31 at 2:30pm with Dr. Alvis Lemmings.   Cecile Sheerer, MSW, LCSWA Office: [phone] Cell: [phone] Fax: [phone]

## 2023-03-17 NOTE — Progress Notes (Signed)
Notified PA of x-ray results. New orders placed.    Tilden Dome, LPN

## 2023-03-17 NOTE — Progress Notes (Signed)
IP Rehab Bowel Program Documentation   Bowel Program Start time 1800  Dig Stim Indicated? Yes , per Md order Dig Stim Prior to Suppository Yes, per MD order  Output from dig stim: smear  Ordered intervention: Suppository Yes ,  mini enema No ,   Repeat dig stim after Suppository or Mini enema  X ,  Output?    Bowel Program Complete? No ,   handoff given Alona Bene, LPN  Patient Tolerated? No   Bowel program not complete at this time, notified oncoming nurse to complete bowel program    Tilden Dome, LPN
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
## 2023-03-17 NOTE — Progress Notes (Signed)
Physical Therapy Session Note  Patient Details  Name: Lee [name] MRN: [id number] Date of Birth: 08/19/2006  Today's Date: 03/18/2023 PT Individual Time: 1047-1200 and 1355 - 1500 PT Individual Time Calculation (min): 73 min and 65 min  Short Term Goals: Week 1:  PT Short Term Goal 1 (Week 1): Pt will complete bed mobility with max assist of 1 person PT Short Term Goal 2 (Week 1): Pt will complete slideboard transfer with mod assist of 1 person PT Short Term Goal 3 (Week 1): Pt will tolerate sitting upright in wheelchair for 4 hours outside of therapy PT Short Term Goal 4 (Week 1): Pt will correctly verbalize frequency and type of seated pressure relief to decrease risk for pressure injuries PT Short Term Goal 5 (Week 1): Pt will manage all WC parts as well as BLEs on/off leg rests with supervision  Skilled Therapeutic Interventions/Progress Updates: Pt presented in bed sleeping but easily aroused and agreeable to therapy. Pt states un-rated pain that "hurts a little", increased during session with nsg notified and received pain meds at end of session. Pt noted to not have gown on as went down for x-ray earlier. PTA donned TED hose total A, threaded pants total A but pt able to roll with maxA for BLE management to allow PTA to pull pants over hips. Pt was able to don shirt with set up at supine level. Pt then performed supine to sit with modA with PTA managing BLE and pt using HHA to pull self up to sitting. Pt was able to correct balance and achieve sitting with close supervision. Pt then requesting to try lateral scoot transfer to w/c. PTA agreeable but advised that may require more effort due to softer surface. Pt was able to complete with modA due to pant leg catching on w/c seat. Once in w/c pt was able to complete positioning in chair. Pt propelled through day room to ortho gym with pt able to align chair to mat and manage leg rests with increased time and supervision. Pt then completed  lateral scoot transfer to mat with minA and cues for increased anterior weight shifting. At mat pt participated in core activities including small range modified "sit ups" 2 x 10. Pt was able to demonstrate placing and reaching phone laterally at times without UE support and without LOB. Pt also participated in scapular depression with use of yoga blocks 2 x 10. Pt then worked on lateral scoots incorporating increased hip clearance L/R with PTA providing cues for increased anterior weight shifting to increase hip clearance. With cues pt used leg loops to manage LEs while scooting. Pt then performed lateral scoot/squat pivot transfer to w/c with minA and improved hip clearance. Pt c/o increased pain therefore pt requested to return to bed once in room. Pt propelled w/c supervision back to room and was able to complete lateral scoot/squat pivot to bed with minA and improved hip clearance. Pt required maxA for BLE management to return to supine. Pt then rolling modA to remove TLSO. Pt repositioned to comfort and left with nsg present to administer meds, call bell within reach and needs met.    Tx2: Pt presented in bed agreeable to therapy. Pt missed 10 min skilled PT due to vitals and bladder scan. Pt states unrated pain but well controlled at beginning of session. Pt performed rolling L/R with modA to don TLSO. Pt then rolled to R modA to complete supine to sit with HHA for truncal support and PTA managing BLE.  Pt requesting to transfer via lateral scoot/squat pivot. Pt was able to scoot anteirorly to EOB with CGA and verbal cues. Pt attempted lateral scoot transfer but was unable to fully clear hips. On second attempt pt was able to complete with min/modA. Pt then propelled to main gym and participated in the follow seated UR therex for strength and conditioning to build endurance for w/c propulsion. Pt performed the following activities.  Bicep curls to fatigue 7lb weights Triceps extension x 20 6lb  weights Overhead press 7lb 3 x 10 Chest press 8lb dowel x 30 Rows with 5lb dowel and blue theraband x 30  Pt then propelled to ortho gym and participated in boxing 30sec x 3 for increased intensity activity. Pt then propelled back to room with PTA encouraging pt to "race" PTA to increase propulsion intensity. Once in room nsg arrived requesting pt be returned to bed as requires in/out cath. Nsg also requesting set up of mirror for education. PTA went to obtain mirror and in duration pt was able to transfer with Slide board and nsg. Pt left in bed at end of session with nsg present to initiate in/out cath.      Therapy Documentation Precautions:  Precautions Precautions: Fall, Other (comment), Back Precaution Comments: paraplegia, chest tube Required Braces or Orthoses: Spinal Brace Spinal Brace: Thoracolumbosacral orthotic, Applied in supine position Restrictions Weight Bearing Restrictions: No General:   Vital Signs: Therapy Vitals Temp: 98 F (36.7 C) Pulse Rate: 85 Resp: 16 BP: 107/76 Patient Position (if appropriate): Lying Oxygen Therapy SpO2: 97 % O2 Device: Room Air Pain:   Mobility:   Locomotion :    Trunk/Postural Assessment :    Balance:   Exercises:   Other Treatments:      Therapy/Group: Individual Therapy  Nabiha Planck 03/18/2023, 8:50 AM
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
## 2023-03-17 NOTE — Progress Notes (Signed)
Occupational Therapy Session Note  Patient Details  Name: Lee [name] MRN: [id number] Date of Birth: 17-Jul-2006  Today's Date: 03/17/2023 OT Individual Time: 0800-0900 OT Individual Time Calculation (min): 60 min    Short Term Goals: Week 1:  OT Short Term Goal 1 (Week 1): Pt will perform long sit mat level with B UE support in prep for LB dressing with CGA OT Short Term Goal 2 (Week 1): Pt will transfer to most appropriate shower seating DME with TB and mod A OT Short Term Goal 3 (Week 1): Pt will sit EOB with CGA for BADL's with min A  Skilled Therapeutic Interventions/Progress Updates:   Pt seen for  skilled OT am session with pt eager for shower and mom participating in caregiver educ relating to adaptive bathing techniques, skin protection, spinal precautions. OT issued and trained in use of LH sponge. OT and orienting clinician for OT assist covered all open areas for waterproofing, log rolls to R side with min A with rails then supine to EOB with max fading to mod A, UE supported EOB with min A, SB transfer on and off rolling shower chair with 2 helpers due to nature of task to complex surface and moisture and max fading to mod a assist. UB bathing rolling shower level with min A and max A for LB.  Once back to bed, total A for  incontinence brief donning with OT assisting for repositioning and left with  Xray tech for testing in supine and mom bedside with all needs, nurse call button and bed exit set.   Pain: 6/10 back pain only with activity, meds given earlier and warm shower and repositioning for relief   Therapy Documentation Precautions:  Precautions Precautions: Fall, Other (comment), Back Precaution Comments: paraplegia, chest tube Required Braces or Orthoses: Spinal Brace Spinal Brace: Thoracolumbosacral orthotic, Applied in supine position Restrictions Weight Bearing Restrictions: No   Therapy/Group: Individual Therapy  [name] 03/17/2023, 7:43 AM

## 2023-03-18 MED ORDER — MELATONIN 5 MG PO TABS
5.0000 mg | ORAL_TABLET | Freq: Every day | ORAL | Status: DC
  Administered 2023-03-18 – 2023-03-23 (×6): 5 mg via ORAL
  Filled 2023-03-18 (×6): qty 1

## 2023-03-18 MED ORDER — SORBITOL 70 % SOLN
60.0000 mL | Freq: Once | Status: AC
  Administered 2023-03-18: 60 mL via ORAL
  Filled 2023-03-18: qty 60

## 2023-03-18 NOTE — Progress Notes (Signed)
Pt made aware that if he had not had a bowel movement by 2200 that we would need to do an enema.  Pt declined and said he did not want it.  This LPN educated pt on the need for enema but pt still refused the enema.

## 2023-03-18 NOTE — Progress Notes (Addendum)
Occupational Therapy Session Note  Patient Details  Name: Lee [name] MRN: [id number] Date of Birth: [birth year]  Today's Date: 03/18/2023 Co-treatment: 1030-1100 (family conference 1000-1030 other worked hours)   Co Treatment Time: 30 min     Short Term Goals: Week 1:  OT Short Term Goal 1 (Week 1): Pt will perform long sit mat level with B UE support in prep for LB dressing with CGA OT Short Term Goal 2 (Week 1): Pt will transfer to most appropriate shower seating DME with TB and mod A OT Short Term Goal 3 (Week 1): Pt will sit EOB with CGA for BADL's with min A  Skilled Therapeutic Interventions/Progress Updates:   Pt participated with team including OT clinician, MSW, PT, MD and nursing for [facility] Meeting. Each discipline discussed scope of SCI care, goals, concern and plan and allowed family who participated (family present F2F and via Face Time) to ask questions, problem solve current barriers and expectations as well as plan for upcoming sessions to allow pt's success. Pt active participant and family responsive to all inputs. OT emphasized importance of daily routine to maximize learning and goal achievement and will participate in custom seating eval next week with PT. Left meeting with family questions answered and ongoing open communication understood.   Therapy Documentation Precautions:  Precautions Precautions: Fall, Other (comment), Back Precaution Comments: paraplegia, chest tube Required Braces or Orthoses: Spinal Brace Spinal Brace: Thoracolumbosacral orthotic, Applied in supine position Restrictions Weight Bearing Restrictions: No     Therapy/Group: Co-Treatment  [name] 03/18/2023, 12:47 PM

## 2023-03-18 NOTE — Progress Notes (Signed)
IP Rehab Bowel Program Documentation   Bowel Program Start time [phone]   Dig Stim Indicated? Yes  Dig Stim Prior to Suppository or mini Enema X 2   Output from dig stim: Small  Ordered intervention: Suppository Yes , mini enema No ,   Repeat dig stim after Suppository or Mini enema  X 2,  Output? Minimal   Bowel Program Complete? No , handoff given Alona Bene  Patient Tolerated? Yes

## 2023-03-18 NOTE — Progress Notes (Signed)
Physical Therapy Session Note  Patient Details  Name: Lee [name] MRN: [id number] Date of Birth: 2006/04/23  Today's Date: 03/18/2023 PT Individual Time: 1300-1345, 1445-1530 PT Individual Time Calculation (min): 45 min, 45 min   Short Term Goals: Week 1:  PT Short Term Goal 1 (Week 1): Pt will complete bed mobility with max assist of 1 person PT Short Term Goal 2 (Week 1): Pt will complete slideboard transfer with mod assist of 1 person PT Short Term Goal 3 (Week 1): Pt will tolerate sitting upright in wheelchair for 4 hours outside of therapy PT Short Term Goal 4 (Week 1): Pt will correctly verbalize frequency and type of seated pressure relief to decrease risk for pressure injuries PT Short Term Goal 5 (Week 1): Pt will manage all WC parts as well as BLEs on/off leg rests with supervision  Skilled Therapeutic Interventions/Progress Updates:    Session 1: pt received in bed and agreeable to therapy. Pt reports unrated head ache pain, nsg made aware. Rolling with min A to don brace. Supine>sit with assist for BLE only. Pt reports some dizziness, but vitals WNL. Moderate improvement by end of session. Discussed possible dehydration and that pt has been supine all morning. Session focused on squat pivot transfers, min a fading to near CGA, occasional assist for lift. Pt returned to room and was agreeable to stay up for part of hour before next session. Continued education on the importance of staying up in w/c. Pt was left with all needs in reach and alarm active.   Session 2: .pt received in bed and agreeable to therapy. Continued unrated head pain, nsg aware but pt not due for any medications. No dizziness this session. Supine>sit with bed features and leg loops with supervision (!!!) and cueing only. Cues for using bed to assist. Pt then performed light min A squat pivot to w/c. Family education with Lee [name] (girlfriend) to check off for transfers. Pt performed slideboard transfer with Lee [name] x  3, both expressed understanding of safety requirements. Updated safety plan. Pt then used standing frame, tolerating `3 min with no changes in vital signs or orthostatic symptoms. Pt returned to room and was  remained in chair with Lee [name] present and needs in reach.    Therapy Documentation Precautions:  Precautions Precautions: Fall, Other (comment), Back Precaution Comments: paraplegia, chest tube Required Braces or Orthoses: Spinal Brace Spinal Brace: Thoracolumbosacral orthotic, Applied in supine position Restrictions Weight Bearing Restrictions: No General:       Therapy/Group: Individual Therapy  [name] 03/18/2023, 4:38 PM

## 2023-03-18 NOTE — Progress Notes (Signed)
Patient refused sorbitol until 5 PM. Dr Berline Chough aware give soap suds enema at 10 PM
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
## 2023-03-18 NOTE — Progress Notes (Signed)
IP Rehab Bowel Program Documentation   Bowel Program Start time [phone]  Dig Stim Indicated? Yes  Dig Stim Prior to Suppository or mini Enema X 2   Output from dig stim: Minimal  Ordered intervention: Suppository Yes , mini enema No ,   Repeat dig stim after Suppository or Mini enema  X 3,  Output? Minimal   Bowel Program Complete? Yes , handoff given to this LPN from off going nurse, Annice Pih  Patient Tolerated? Yes

## 2023-03-18 NOTE — Progress Notes (Addendum)
Physical Therapy Session Note  Patient Details  Name: Lee [name] MRN: [id number] Date of Birth: 2005/11/15  Today's Date: 03/18/2023 PT co-tx time: 1000-1030 PT co-tx time calculation: 30 min   Short Term Goals: Week 1:  PT Short Term Goal 1 (Week 1): Pt will complete bed mobility with max assist of 1 person PT Short Term Goal 2 (Week 1): Pt will complete slideboard transfer with mod assist of 1 person PT Short Term Goal 3 (Week 1): Pt will tolerate sitting upright in wheelchair for 4 hours outside of therapy PT Short Term Goal 4 (Week 1): Pt will correctly verbalize frequency and type of seated pressure relief to decrease risk for pressure injuries PT Short Term Goal 5 (Week 1): Pt will manage all WC parts as well as BLEs on/off leg rests with supervision  Skilled Therapeutic Interventions/Progress Updates:    Pt seen with family and care team present for family conference. Conference focused on prognosis and rehab expectations. This therapist provided education on expected rehab progression and process. Emphasized importance of sitting OOB to build core strength and upright tolerance, as well as being prepared for therapy sessions for best use of time. Pt and family receptive. Family with focus on use of estim and standing for hopeful return of LE motor function. Discussed benefits of standing long term, and plan to use estim during rehab stay, but that he will need to work on other skills to get the most out those interventions. Also discussed scheduled w/c eval and plan for equipment. Pt remained in bed at end of session with CSW to continue conversation with family.   Therapy Documentation Precautions:  Precautions Precautions: Fall, Other (comment), Back Precaution Comments: paraplegia, chest tube Required Braces or Orthoses: Spinal Brace Spinal Brace: Thoracolumbosacral orthotic, Applied in supine position Restrictions Weight Bearing Restrictions: No General: PT Amount of Missed  Time (min): 10 Minutes PT Missed Treatment Reason: Nursing care Vital Signs:    Therapy/Group: Co-Treatment  Alandria Butkiewicz C Elhadji Pecore 03/18/2023, 12:41 PM

## 2023-03-18 NOTE — Progress Notes (Signed)
Occupational Therapy Session Note  Patient Details  Name: Lee [name] MRN: [id number] Date of Birth: 01-Nov-2005  Today's Date: 03/18/2023 OT Individual Time: 0800-0900 OT Individual Time Calculation (min): 60 min    Short Term Goals: Week 1:  OT Short Term Goal 1 (Week 1): Pt will perform long sit mat level with B UE support in prep for LB dressing with CGA OT Short Term Goal 2 (Week 1): Pt will transfer to most appropriate shower seating DME with TB and mod A OT Short Term Goal 3 (Week 1): Pt will sit EOB with CGA for BADL's with min A  Skilled Therapeutic Interventions/Progress Updates:   Pt seen for skilled OT this am. Focus of session on LB adapted methods for dressing. OT had orienting clinician as well for treatment. OT provided cloths for UB and peri/buttocks bathing. Set up with bed features for UB bathing and peri area with max A for buttocks and incontinence brief with min a for rolling B this session with bed rails. Deodorant and tshirt with set up. OT donned thigh high TEDS and leg loops. Provided LE gentle stretch with pt involved in trial with leg loops with HOB elevated ~ 60 degrees. Pt now with Foley cath replaced due to MD ordering to allow "rest". OT instructed in threading catheter bag through shorts, use of leg loops and rolling for pulling up over hips overall max a. Requested HOB lowered for pain relief and rest. Intermittent family education with grandmother throughout session. Pt left bed level with exit alarm engaged, needs and nurse call button in reach.   Pain: 9/10 headache and back, repositioning, meds administered, warm sponge bath with 8/10 relief    Therapy Documentation Precautions:  Precautions Precautions: Fall, Other (comment), Back Precaution Comments: paraplegia, chest tube Required Braces or Orthoses: Spinal Brace Spinal Brace: Thoracolumbosacral orthotic, Applied in supine position Restrictions Weight Bearing Restrictions: No   Therapy/Group:  Individual Therapy  [name] 03/18/2023, 7:46 AM

## 2023-03-19 MED ORDER — DOCUSATE SODIUM 100 MG PO CAPS
100.0000 mg | ORAL_CAPSULE | Freq: Every day | ORAL | Status: DC
  Administered 2023-03-20 – 2023-04-08 (×17): 100 mg via ORAL
  Filled 2023-03-19 (×21): qty 1

## 2023-03-19 NOTE — Progress Notes (Signed)
IP Rehab Bowel Program Documentation   Bowel Program Start time 1800  Dig Stim Indicated? Yes  Dig Stim Prior to Suppository or mini Enema X 2   Output from dig stim: Small  Ordered intervention: Suppository Yes , mini enema Yes ,   Repeat dig stim after Suppository or Mini enema  X 2,  Output? Moderate   Bowel Program Complete? no, handoff given Desharta   Patient Tolerated? Yes

## 2023-03-19 NOTE — Progress Notes (Signed)
Occupational Therapy Weekly Progress Note  Patient Details  Name: Lee [name] MRN: [id number] Date of Birth: [birth year]  Beginning of progress report period: March 12, 2023 End of progress report period: March 19, 2023  Today's Date: 03/19/2023 OT Individual Time: 0900-1004 & 1300-1340 OT Individual Time Calculation (min): 64 & 40 min    Patient has met 3 of 3 short term goals.  Pt has demonstrated increased independence with ADLs such as bathing and functional transfers. Pt will benefit from continued skilled OT in IPR in order to address ADLs and maximize independence in preparation for discharge. Family conference earlier this week, see 6/27 note for details. W/c eval scheduled for next week to prepare for d/c. Pt is most limited by pain and unwillingness to sit out of bed for improved endurance and core strength, and sessions are often limited by time required to get pt out of bed during the day.   Patient continues to demonstrate the following deficits: muscle weakness, decreased cardiorespiratoy endurance, unbalanced muscle activation, and decreased sitting balance, decreased postural control, and decreased balance strategies and therefore will continue to benefit from skilled OT intervention to enhance overall performance with BADL and Reduce care partner burden.  Patient progressing toward long term goals..  Continue plan of care.  OT Short Term Goals Week 1:  OT Short Term Goal 1 (Week 1): Pt will perform long sit mat level with B UE support in prep for LB dressing with CGA OT Short Term Goal 1 - Progress (Week 1): Met OT Short Term Goal 2 (Week 1): Pt will transfer to most appropriate shower seating DME with TB and mod A OT Short Term Goal 2 - Progress (Week 1): Met OT Short Term Goal 3 (Week 1): Pt will sit EOB with CGA for BADL's with min A OT Short Term Goal 3 - Progress (Week 1): Met Week 2:  OT Short Term Goal 1 (Week 2): Pt will complete LB dressing at bed level with Min A  with AE as necessary OT Short Term Goal 2 (Week 2): Pt will complete lateral transfers with Min A including transfer board placement and leg management OT Short Term Goal 3 (Week 2): Pt will complete bathing with Min A with AE as necessary  Skilled Therapeutic Interventions/Progress Updates:      Therapy Documentation Precautions:  Precautions Precautions: Fall, Other (comment), Back Precaution Comments: paraplegia, chest tube Required Braces or Orthoses: Spinal Brace Spinal Brace: Thoracolumbosacral orthotic, Applied in supine position Restrictions Weight Bearing Restrictions: No  Session 1 General:  " Hi" Pt supine in bed with girlfriend in bed upon OT arrival, agreeable to OT session. Pt experienced emesis this session as well as incontinent BM. Nurse notified of illness experiences during session. Pt progress limited by lethargy and pain this date, d/t this, levels of assist are increased compared to overall levels of assist.  Pain: 10/10 pain reported, nurse notified during session. Distraction, empathetic discussion and shower for pain management.  ADL: Bed mobility: overall Mod A; Min A to manage LB in bed to complete log roll side to side, supine ><EOB Mod A  Grooming: Mod A, to wash face, girlfriend applied deodorant  Oral hygiene: Min A, girlfriend applied toothpaste to toothbrush Toileting: Total A, incontinent of bowel, pt completed W/C push up while OT removed pants/brief and completed hygiene UB dressing: CGA for doffing/donning overhead shirt at W/C level LB dressing: Max A bed level rolling side to side to manage over waist, OT donned over feet, pt  attempted to assist over waist but unable to complete Footwear: total A for PRAFO boots and socks bed level Shower transfer: total A, pt in roll in shower chair for bathing Bathing: Mod A, pt completed UB bathing, front peri area Transfers: Mod A emerging Min A for later transfers from EOB>< shower chair, OT assisted with  lateral transfer board placement     Education:  Pt educated on importance of successful bowel program in order for decreased stomach pains and emesis. Pt educated on importance of eating and proper nutrition in order for healing.   Pt supine in bed with bed alarm activated, 2 bed rails up, call light within reach and 4Ps assessed.   Session 2 General:  "Hi again." Pt supine in bed with girlfriend upon OT arrival, agreeable to OT session. Girlfriend present for session. Pt with increased lethargy for OT session this date.   Vital Signs: BP: 138/82 in lying position, noted in flowsheets and nurse notified.   Pain: 10/10 reported in back. Pt reported no emesis since morning session. Positioning, distraction and empathetic conversation for pain management. Reported recent medication.   Exercises: Pt issued UE blue and green theraband HEP in order to increase functional strength, andurance and activity tolerance in order to increase independence in ADLs such as bathing. Pt issued yellow theraband and discussed direction/technique of exercises, demonstrating verbal understanding.   Other Treatments:  Pt reports muscle tightness in legs and asked OT to stretch legs. OT stretched BLE in hip/knee flexion, internal/external hip rotation for multiple trials in order to prepare for leg management when completing LB dressing at bed level. Some stretching movements restricted d/t girlfriend present in bed sleeping.   Education: OT educated pt importance of OOB tolerance in order to increase functional activity tolerance for daily ADLs.    Pt supine in bed with bed alarm activated, 2 bed rails up, call light within reach and 4Ps assessed. Girlfriend present.   Therapy/Group: Individual Therapy  Velia Meyer, OTD, OTR/L 03/19/2023, 9:44 PM

## 2023-03-19 NOTE — Progress Notes (Signed)
Patient was educated earlier in the day that he was on enteric and contact precaution. Mekidas was called to the room by patient, patients mother on the phone stating that her sister has the norovirus and her son has the same symptoms he should be tested. Patient and GF agreed. Mekadis spoke to Dr. Berline Chough and Jesusita Oka A. Norovirus screening ordered.  Patient was educated that he could not leave his room until a stool sample was obtained and norovirus was RO, anybody that comes in the room is requied to mask and wash hands with soap and water coming in and leaving. Patient grandmother, Salvadore Dom, and cousin in the room. Patient and GF insisting he is going outside and they no idea about no norovirus. Grandmother mad due to the fact that I told patient you and your GF were educated today. Your mother was on the phone and the charge nurse was called into the room. Patient and GF still acted as if they had no idea. Explained to Grandmother that the patient is an adult and they were educated.

## 2023-03-19 NOTE — Progress Notes (Signed)
Physical Therapy Weekly Progress Note  Patient Details  Name: Lee [name] MRN: [id number] Date of Birth: 06-Dec-2005  Beginning of progress report period: March 12, 2023 End of progress report period: March 19, 2023  Patient has met 3 of 4 short term goals.  Pt is progressing well with sitting balance, bed mobility, and transfers. He performs squat pivots with as little as min A at times and is able to direct care for slideboard transfers. Initiated family education to check off family members for transfers. Family conference earlier this week, see 6/27 note for details. W/c eval scheduled for next week to prepare for d/c. Pt is most limited by pain and unwillingness to sit out of bed for improved endurance and core strength, and sessions are often limited by time required to get pt out of bed during the day.   Patient continues to demonstrate the following deficits muscle weakness, muscle joint tightness, and muscle paralysis, impaired timing and sequencing, abnormal tone, unbalanced muscle activation, and decreased coordination, and decreased sitting balance, decreased postural control, and difficulty maintaining precautions and therefore will continue to benefit from skilled PT intervention to increase functional independence with mobility.  Patient progressing toward long term goals..  Continue plan of care.  PT Short Term Goals Week 1:  PT Short Term Goal 1 (Week 1): Pt will complete bed mobility with max assist of 1 person PT Short Term Goal 1 - Progress (Week 1): Met PT Short Term Goal 2 (Week 1): Pt will complete slideboard transfer with mod assist of 1 person PT Short Term Goal 2 - Progress (Week 1): Met PT Short Term Goal 3 (Week 1): Pt will tolerate sitting upright in wheelchair for 4 hours outside of therapy PT Short Term Goal 3 - Progress (Week 1): Not met PT Short Term Goal 4 (Week 1): Pt will correctly verbalize frequency and type of seated pressure relief to decrease risk for  pressure injuries PT Short Term Goal 4 - Progress (Week 1): Met PT Short Term Goal 5 (Week 1): Pt will manage all WC parts as well as BLEs on/off leg rests with supervision PT Short Term Goal 5 - Progress (Week 1): Met Week 2:  PT Short Term Goal 1 (Week 2): Pt will tolerate sitting up in w/c >4 hours PT Short Term Goal 2 (Week 2): Pt will self propel w/c in community environment PT Short Term Goal 3 (Week 2): Pt will perform bed mobility with supervision and leg loops consistently  Skilled Therapeutic Interventions/Progress Updates:  Balance/vestibular training;DME/adaptive equipment instruction;Neuromuscular re-education;Stair training;UE/LE Strength taining/ROM;Wheelchair propulsion/positioning;Functional electrical stimulation;Pain management;Discharge planning;Community reintegration;Psychosocial support;Therapeutic Activities;UE/LE Coordination activities;Disease management/prevention;Functional mobility training;Patient/family education;Splinting/orthotics;Therapeutic Exercise;Ambulation/gait training   Therapy Documentation Precautions:  Precautions Precautions: Fall, Other (comment), Back Precaution Comments: paraplegia, chest tube Required Braces or Orthoses: Spinal Brace Spinal Brace: Thoracolumbosacral orthotic, Applied in supine position Restrictions Weight Bearing Restrictions: No    Therapy/Group: Individual Therapy  [name] 03/19/2023, 4:42 PM

## 2023-03-19 NOTE — Progress Notes (Signed)
Pt's catheter bag was leaking and needed to be changed.  This LPN was made aware that foley catheter would have to be completely replaced.  Existing foley removed and new one placed by this LPN with Lucita Lora, RN assisting.

## 2023-03-19 NOTE — Progress Notes (Signed)
Patient ID: Lee [name], male   DOB: 19-Feb-2006, 19 y.o.   MRN: [id number]  SW submitted disability referral to [facility].   Cecile Sheerer, MSW, LCSWA Office: [phone] Cell: [phone] Fax: [phone]

## 2023-03-19 NOTE — Progress Notes (Signed)
IP Rehab Bowel Program Documentation   Bowel Program Start time 1800   Dig Stim Indicated? Yes  Dig Stim Prior to Suppository or mini Enema X 2   Output from dig stim: Small  Ordered intervention: Suppository Yes , mini enema Yes ,   Repeat dig stim after Suppository or Mini enema  X 2,  Output? Moderate   Bowel Program Complete? Yes , handoff given na   Patient Tolerated? Yes   Additional note from Emiliano Dyer, RN Dig stim performed empty rectum.
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
## 2023-03-19 NOTE — Progress Notes (Signed)
Pt told this LPN that while he was out on his grounds pass he had gotten sick and vomited.
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
## 2023-03-19 NOTE — Progress Notes (Addendum)
Physical Therapy Session Note  Patient Details  Name: Lee [name] MRN: [id number] Date of Birth: [birth year]  Today's Date: 03/19/2023     Short Term Goals: Week 1:  PT Short Term Goal 1 (Week 1): Pt will complete bed mobility with max assist of 1 person PT Short Term Goal 2 (Week 1): Pt will complete slideboard transfer with mod assist of 1 person PT Short Term Goal 3 (Week 1): Pt will tolerate sitting upright in wheelchair for 4 hours outside of therapy PT Short Term Goal 4 (Week 1): Pt will correctly verbalize frequency and type of seated pressure relief to decrease risk for pressure injuries PT Short Term Goal 5 (Week 1): Pt will manage all WC parts as well as BLEs on/off leg rests with supervision  Skilled Therapeutic Interventions/Progress Updates: Tx1: Pt presented in bed with GF present, sleeping. Pt's girlfriend spent several minutes waking up pt as PTA left room. Upon PTA's return GF stating he's not feeling well and doesn't want to do anything. Pt continued sleeping and with brief discussion with GF, had episode of emesis this am and was frustrated with previous therapist this am and a bit agitated so has just been sleeping. Advised will return for pm session and if he's willing will try e-stim so he can stay in bed. GF will encourage pt for pm session. Pt missed 45 min skilled PT due to refusal.   Tx2: Upon PTA entering room pt was awake but stating that he still doesn't feel well and does not want to participate in therapy. Pt not willing to try e-stim this session but states willing to state at different date. Pt missed 45 min skilled PT due to unwillingness to participate.      Therapy Documentation Precautions:  Precautions Precautions: Fall, Other (comment), Back Precaution Comments: paraplegia, chest tube Required Braces or Orthoses: Spinal Brace Spinal Brace: Thoracolumbosacral orthotic, Applied in supine position Restrictions Weight Bearing Restrictions:  No General: PT Amount of Missed Time (min): 45 Minutes PT Missed Treatment Reason: Patient unwilling to participate Vital Signs: Therapy Vitals Temp: 98.7 F (37.1 C) Pulse Rate: 62 Resp: 16 BP: (!) 109/53 Patient Position (if appropriate): Lying Oxygen Therapy SpO2: 98 % O2 Device: Room Air O2 Flow Rate (L/min): 98.8 L/min Pain: Pain Assessment Pain Scale: 0-10 Pain Score: 7  Faces Pain Scale: Hurts a little bit Pain Type: Acute pain Pain Location: Head Pain Orientation: Right;Left Pain Descriptors / Indicators: Aching Pain Frequency: Constant Pain Onset: On-going Patients Stated Pain Goal: 0 Pain Intervention(s): Medication (See eMAR) Multiple Pain Sites: Yes PAINAD (Pain Assessment in Advanced Dementia) Breathing: normal Negative Vocalization: none Facial Expression: smiling or inexpressive Body Language: relaxed Consolability: no need to console PAINAD Score: 0   Therapy/Group: Individual Therapy  Almee Pelphrey 03/19/2023, 8:29 AM

## 2023-03-19 NOTE — Progress Notes (Signed)
Patient with complaint of headaches. Mother, who is in [place] was on the phone with pt and says that a family member had been having similar symptoms and was diagnosed with Norovirus. She is concerned that he may have norovirus. Order obtain to test for Norovirus and pt placed on enteric precautions pending results.   Marylu Lund, RN

## 2023-03-20 ENCOUNTER — Inpatient Hospital Stay (HOSPITAL_COMMUNITY): Payer: Medicaid Other

## 2023-03-20 LAB — CBC WITH DIFFERENTIAL/PLATELET
Abs Immature Granulocytes: 0.05 10*3/uL (ref 0.00–0.07)
Basophils Absolute: 0 10*3/uL (ref 0.0–0.1)
Basophils Relative: 0 %
Eosinophils Absolute: 0.2 10*3/uL (ref 0.0–0.5)
Eosinophils Relative: 2 %
HCT: 32.3 % — ABNORMAL LOW (ref 39.0–52.0)
Hemoglobin: 10.4 g/dL — ABNORMAL LOW (ref 13.0–17.0)
Immature Granulocytes: 1 %
Lymphocytes Relative: 18 %
Lymphs Abs: 1.7 10*3/uL (ref 0.7–4.0)
MCH: 28.1 pg (ref 26.0–34.0)
MCHC: 32.2 g/dL (ref 30.0–36.0)
MCV: 87.3 fL (ref 80.0–100.0)
Monocytes Absolute: 0.6 10*3/uL (ref 0.1–1.0)
Monocytes Relative: 7 %
Neutro Abs: 7 10*3/uL (ref 1.7–7.7)
Neutrophils Relative %: 72 %
Platelets: 375 10*3/uL (ref 150–400)
RBC: 3.7 MIL/uL — ABNORMAL LOW (ref 4.22–5.81)
RDW: 13.5 % (ref 11.5–15.5)
WBC: 9.6 10*3/uL (ref 4.0–10.5)
nRBC: 0 % (ref 0.0–0.2)

## 2023-03-20 LAB — COMPREHENSIVE METABOLIC PANEL
ALT: 53 U/L — ABNORMAL HIGH (ref 0–44)
AST: 28 U/L (ref 15–41)
Albumin: 3.2 g/dL — ABNORMAL LOW (ref 3.5–5.0)
Alkaline Phosphatase: 74 U/L (ref 38–126)
Anion gap: 9 (ref 5–15)
BUN: 15 mg/dL (ref 6–20)
CO2: 28 mmol/L (ref 22–32)
Calcium: 9.3 mg/dL (ref 8.9–10.3)
Chloride: 100 mmol/L (ref 98–111)
Creatinine, Ser: 0.83 mg/dL (ref 0.61–1.24)
GFR, Estimated: >60 mL/min (ref >60)
Glucose, Bld: 105 mg/dL — ABNORMAL HIGH (ref 70–99)
Potassium: 3.7 mmol/L (ref 3.5–5.1)
Sodium: 137 mmol/L (ref 135–145)
Total Bilirubin: 0.6 mg/dL (ref 0.3–1.2)
Total Protein: 6.9 g/dL (ref 6.5–8.1)

## 2023-03-20 MED ORDER — MAGNESIUM CITRATE PO SOLN
1.0000 | Freq: Once | ORAL | Status: AC
  Administered 2023-03-20: 1 via ORAL
  Filled 2023-03-20: qty 296

## 2023-03-20 NOTE — Progress Notes (Signed)
IP Rehab Bowel Program Documentation   Bowel Program Start time [id number]  Dig Stim Indicated? Yes  Dig Stim Prior to Suppository or mini Enema X 1   Output from dig stim: small  Ordered intervention: Suppository Yes , mini enema ***, Mg citrate  Repeat dig stim after Suppository or Mini enema  X ***,  Output? small   Bowel Program Complete? {YES/NO:21197}, handoff given ***  Patient Tolerated? Yes
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
## 2023-03-20 NOTE — Progress Notes (Incomplete)
Occupational Therapy Session Note  Patient Details  Name: Lee [name] MRN: [id number] Date of Birth: 2006-08-29  {CHL IP REHAB OT TIME CALCULATIONS:[id number]}   Short Term Goals: Week 2:  OT Short Term Goal 1 (Week 2): Pt will complete LB dressing at bed level with Min A with AE as necessary OT Short Term Goal 2 (Week 2): Pt will complete lateral transfers with Min A including transfer board placement and leg management OT Short Term Goal 3 (Week 2): Pt will complete bathing with Min A with AE as necessary  Skilled Therapeutic Interventions/Progress Updates:  Pt received *** for skilled OT session with focus on ***. Pt agreeable to interventions, demonstrating overall *** mood. Pt reported ***/10 pain, stating "***" in reference to ***. OT offering intermediate rest breaks and positioning suggestions throughout session to address pain/fatigue and maximize participation/safety in session.    Pt remained *** with all immediate needs met at end of session. Pt continues to be appropriate for skilled OT intervention to promote further functional independence.   Therapy Documentation Precautions:  Precautions Precautions: Fall, Other (comment), Back Precaution Comments: paraplegia, chest tube Required Braces or Orthoses: Spinal Brace Spinal Brace: Thoracolumbosacral orthotic, Applied in supine position Restrictions Weight Bearing Restrictions: No   Therapy/Group: Individual Therapy  Lou Cal, OTR/L, MSOT  03/20/2023, 8:21 PM

## 2023-03-20 NOTE — Progress Notes (Signed)
Patient's mother called (from [place]) in request to have him off precaution as the result of her family member - who was tested for norovirus, came back negative. Stated that the patient is only being tested because of her request. Nurse informed family member and patient that once the testing is in process, we cannot lift it until the patient's result comes back negative.    Patient also stated that he was given enema suds and doesn't want it performed again if needed.   MD made aware.

## 2023-03-21 MED ORDER — MAGNESIUM CITRATE PO SOLN
1.0000 | Freq: Once | ORAL | Status: AC
  Administered 2023-03-21: 1 via ORAL
  Filled 2023-03-21: qty 296

## 2023-03-21 MED ORDER — SENNA 8.6 MG PO TABS
3.0000 | ORAL_TABLET | Freq: Every day | ORAL | Status: DC
  Administered 2023-03-21 – 2023-04-08 (×16): 25.8 mg via ORAL
  Filled 2023-03-21 (×20): qty 3

## 2023-03-21 MED ORDER — SORBITOL 70 % SOLN
960.0000 mL | TOPICAL_OIL | Freq: Once | ORAL | Status: AC
  Administered 2023-03-21: 960 mL via RECTAL
  Filled 2023-03-21: qty 240

## 2023-03-21 MED ORDER — LIDOCAINE 5 % EX PTCH
3.0000 | MEDICATED_PATCH | CUTANEOUS | Status: DC
  Administered 2023-03-21 – 2023-03-25 (×4): 3 via TRANSDERMAL
  Filled 2023-03-21 (×12): qty 3
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
## 2023-03-21 NOTE — Progress Notes (Signed)
Patient had an unexpected, but large amount, emesis occurrence after medication administration with water. HOB was upright and patient did not complain of any nausea prior to administration.   VS were taken. Patient stated they felt fine post-emesis. Patient was asked what he ate prior to the emesis occurrence and he stated, "[employer] fries- warm." Patient educated on the importance of eating more protein and less fried, greasy food to prevent irritation and nausea.   Family was also made aware and requested to bring him home-cooked meals.   SMOG and bowel program performed. Output was small. Marland Kitchen

## 2023-03-21 NOTE — Progress Notes (Signed)
IP Rehab Bowel Program Documentation   Bowel Program Start time [phone]  Dig Stim Indicated? Yes  Dig Stim Prior to Suppository or mini Enema X 2   Output from dig stim: Moderate  Ordered intervention: Suppository Yes , mini enema Yes ,  2100 3/4 of mag citrate consumed, dig stimulated 2230 very large soft/loose stool, rectum check clear.  Repeat dig stim after Suppository or Mini enema  X1,  Output? Small   Bowel Program Complete? Yes , handoff given   Patient Tolerated? Yes

## 2023-03-21 NOTE — Progress Notes (Signed)
Pt c/o of headache for 3 days unresolved with current medications, states its not a "regular headache maybe a migraine". On call provider was notified and suggested a possible migraine cocktail of Toradol and phenergan IM. Pt refused states he doesn't want anymore meds he just wants to find out why he keeps having the headache.   Patient given cool cloth and Ice pack. Dimmed the lights and low stimulation. Pt states it helps a little

## 2023-03-22 ENCOUNTER — Inpatient Hospital Stay (HOSPITAL_COMMUNITY): Payer: Medicaid Other

## 2023-03-22 DIAGNOSIS — R519 Headache, unspecified: Secondary | ICD-10-CM

## 2023-03-22 DIAGNOSIS — M549 Dorsalgia, unspecified: Secondary | ICD-10-CM

## 2023-03-22 DIAGNOSIS — D62 Acute posthemorrhagic anemia: Secondary | ICD-10-CM

## 2023-03-22 MED ORDER — TOPIRAMATE 25 MG PO TABS
25.0000 mg | ORAL_TABLET | Freq: Every day | ORAL | Status: DC
  Administered 2023-03-22 – 2023-03-23 (×2): 25 mg via ORAL
  Filled 2023-03-22 (×2): qty 1

## 2023-03-22 MED ORDER — MAGNESIUM CITRATE PO SOLN
0.5000 | Freq: Once | ORAL | Status: DC
  Filled 2023-03-22: qty 296

## 2023-03-22 NOTE — Progress Notes (Signed)
Patient with projectile vomiting, states he has been dealing with a severe headache and "has been telling people" since he has been here. MD had ordered Topamax which this nurse was coming in the room to give, but unable due to patient actively vomiting. Assisted patient with complete bed change, second nurse came in to assist gave IV Zofran IM per PA order Dan who came to see patient. Per PA, OK to continue to give Topamax as patient had stopped vomiting.

## 2023-03-22 NOTE — Progress Notes (Signed)
IP Rehab Bowel Program Documentation   Bowel Program Start time 1840  Dig Stim Indicated? Yes  Dig Stim Prior to Suppository-YES  Output from dig stim: Minimal  Ordered intervention: Suppository Yes

## 2023-03-22 NOTE — Progress Notes (Signed)
Spoke with Dr Berline Chough regarding Mr Roger's family concern. Will order a head  CT without contrast. Spoke with RN regarding the above she verbalizes understanding

## 2023-03-22 NOTE — Progress Notes (Addendum)
Patient has 1/2 bottle of mag citrate, per MD Benjie Karvonen patient only needs to complete the rest of the bottle. Patient finished bottle of Mag

## 2023-03-22 NOTE — Progress Notes (Signed)
Physical Therapy Session Note  Patient Details  Name: Lee [name] MRN: [id number] Date of Birth: 24-Apr-2006  Today's Date: 03/22/2023 PT Individual Time: 1000-1050 PT Individual Time Calculation (min): 50 min   Short Term Goals: Week 2:  PT Short Term Goal 1 (Week 2): Pt will tolerate sitting up in w/c >4 hours PT Short Term Goal 2 (Week 2): Pt will self propel w/c in community environment PT Short Term Goal 3 (Week 2): Pt will perform bed mobility with supervision and leg loops consistently  Skilled Therapeutic Interventions/Progress Updates:    Pt recd in bed, reporting 10/10 pain that he states is a migraine, reporting photosensitivity but no aura or parathesias. Pt refuses any OOB mobility this date. Educated on importance of therapy and continuing his rehab. Pt reports he thinks it's time to transfer. Discussed that all rehab centers would follow similar protocol. Requests MD to "figure out what's going on" before trying the meds recommended by a weekend provider. Pt also reports "I am on so many meds and none of them are working" educated that the absence of certain symptoms mean they are working, but pt was not receptive to education. MD in/out for morning rounds, discussed pt's concerns and migraine type symptoms. Pt agreed to passive ROM only. Provided LE stretching. Pt requested pain medication and refused any further activity at this time. Nsg provided pain medication after session. Pt remained in bed and missed 25 min d/t pain.     Therapy Documentation Precautions:  Precautions Precautions: Fall, Other (comment), Back Precaution Comments: paraplegia, chest tube Required Braces or Orthoses: Spinal Brace Spinal Brace: Thoracolumbosacral orthotic, Applied in supine position Restrictions Weight Bearing Restrictions: No General:      Therapy/Group: Individual Therapy  [name] 03/22/2023, 10:31 AM

## 2023-03-22 NOTE — Progress Notes (Signed)
Occupational Therapy Session Note  Patient Details  Name: Lee [name] MRN: [id number] Date of Birth: [birth year]  Today's Date: 03/22/2023 OT Individual Time: 1300-1400 OT Individual Time Calculation (min): 60 min    Short Term Goals: Week 1:  OT Short Term Goal 1 (Week 1): Pt will perform long sit mat level with B UE support in prep for LB dressing with CGA OT Short Term Goal 1 - Progress (Week 1): Met OT Short Term Goal 2 (Week 1): Pt will transfer to most appropriate shower seating DME with TB and mod A OT Short Term Goal 2 - Progress (Week 1): Met OT Short Term Goal 3 (Week 1): Pt will sit EOB with CGA for BADL's with min A OT Short Term Goal 3 - Progress (Week 1): Met  Skilled Therapeutic Interventions/Progress Updates:     Pt received reclined in bed with GF and grandmother present in room. Pt presenting to be fatigued, however receptive to skilled OT session reporting 2/10 pain with decreased headache since receiving pain medications- OT offering intermittent rest breaks, repositioning, and therapeutic support to optimize participation in therapy session. Pt initially requesting to take shower this session, however changed his mind and requested to go outside instead. Pt able to recall and explain technique for donning TLSO and roll in bed with min A using bed features. Elevated Pt's HOB to position he could tolerate without increase in pain ~35 % and re-educated Pt on how to don pants using leg loops in long sitting position. Pt reporting decreased motivation to complete LB dressing so OT assisted with weaving BLEs to support moral. Pt donned shirt in bed supervision. Supine>EOB mod A to lower BLEs off EOB. Donned thigh high TEDs and shoes total A for time management. Pt able to complete TB transfer EOB>WC with min A with assistance required to place TB, stabilize board, and balance. Transported Pt total A to outdoor location in wc with GF present to provide additional encouragement to  increase Pt's moral and encourage increased time sitting upright. Educated Pt on AD during transportation with emphasis on symptoms and management. Also discussed changes in temperature regulation following SCI as Pt would be experiencing increase in temperature when outdoors with Pt receptive to all education. Pt able to tolerate sitting in wc outdoors in the shade for ~15 minutes. Pt became tearful during session. Engaged Pt in light hearted conversations discussing meaningful activities, psychosocial supports, and simple coping strategies with Pt receptive and mild improvement in moral noted. Pt transported back to room total A in wc. Pt receptive to staying upright in wc to support improved postural alignment and increase OOB tolerance. Pt was left resting in wc with call bell in reach, family/GF present in room, and all needs met.    Therapy Documentation Precautions:  Precautions Precautions: Fall, Other (comment), Back Precaution Comments: paraplegia, chest tube Required Braces or Orthoses: Spinal Brace Spinal Brace: Thoracolumbosacral orthotic, Applied in supine position Restrictions Weight Bearing Restrictions: No  Therapy/Group: Individual Therapy  Army Fossa 03/22/2023, 12:57 PM

## 2023-03-22 NOTE — Progress Notes (Signed)
Patient with persistent headaches, with occasional nausea and vomiting. Minimal relief with meds. Pt descrbed pain as "migraine". Family requesting head CT. Riley Lam NP made aware. Order received for head CT w/o contrast.   Marylu Lund, RN
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
## 2023-03-22 NOTE — Progress Notes (Signed)
Physical Therapy Session Note  Patient Details  Name: Lee [name] MRN: [id number] Date of Birth: 12-Oct-2005  Today's Date: 03/22/2023 PT Individual Time: 1415-1530 PT Individual Time Calculation (min): 75 min   Short Term Goals: Week 2:  PT Short Term Goal 1 (Week 2): Pt will tolerate sitting up in w/c >4 hours PT Short Term Goal 2 (Week 2): Pt will self propel w/c in community environment PT Short Term Goal 3 (Week 2): Pt will perform bed mobility with supervision and leg loops consistently  Skilled Therapeutic Interventions/Progress Updates:    Pt recd in bed with family present, reports headache slightly better. Dan, PA in/out and recommends shower for exposure to steam to improve head congestion. Therapist confirmed shower procedure with OT before proceeding. Pt was able to transition to EOB with assist for BLE only. Donned brace with assist. Squat pivot to shower chair with CGA. Pt was wheeled into bathroom and assisted with doffing brief and brace. Covered chest wound with tegaderm,  confirmed back wound was covered appropriately. Pt's girlfriend who has been trained by OT took over bathing care. Therapist provided education with pt's family. Handed off to nsg at end of therapy time for transfer back to bed.   Therapy Documentation Precautions:  Precautions Precautions: Fall, Other (comment), Back Precaution Comments: paraplegia, chest tube Required Braces or Orthoses: Spinal Brace Spinal Brace: Thoracolumbosacral orthotic, Applied in supine position Restrictions Weight Bearing Restrictions: No General: PT Amount of Missed Time (min): 25 Minutes PT Missed Treatment Reason: Patient unwilling to participate;Pain    Therapy/Group: Individual Therapy  [name] 03/22/2023, 3:36 PM

## 2023-03-23 NOTE — Progress Notes (Signed)
Bowel program started at 1840, with dig stim and suppository. Patient tolerated well. Minimal output noted. Patient had to go to CT, informed oncoming nurse that bowel program needed to be completed. Shared note.
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
## 2023-03-23 NOTE — Patient Care Conference (Signed)
Inpatient RehabilitationTeam Conference and Plan of Care Update Date: 03/23/2023   Time: 11:28 AM   Patient Name: Lee [name]      Medical Record Number: [id number]  Date of Birth: 12-15-2005 Sex: Male         Room/Bed: 4W01C/4W01C-01 Payor Info: Payor: MEDICAID PENDING / Plan: MEDICAID PENDING / Product Type: *No Product type* /    Admit Date/Time:  03/11/2023  7:04 PM  Primary Diagnosis:  Complete paraplegia [facility])  Hospital Problems: Principal Problem:   Complete paraplegia [facility]) Active Problems:   Neurogenic bowel   Neurogenic bladder    Expected Discharge Date: Expected Discharge Date: 04/08/23  Team Members Present: Physician leading conference: Dr. Genice Rouge Social Worker Present: Cecile Sheerer, LCSWA Nurse Present: Vedia Pereyra, RN PT Present: Karolee Stamps, PT OT Present: Roney Mans, OT;Ardis Rowan, COTA PPS Coordinator present : Fae Pippin, SLP     Current Status/Progress Goal Weekly Team Focus  Bowel/Bladder   Pt has an indwelling foley and currently on a bowel program   Pt will gain continence of bowel/bladder   Will assess qshift and PRN    Swallow/Nutrition/ Hydration               ADL's   Bathing shower level CGA, LB tot A fading to max A, UB dressing set up, TLSO mod A, LB dressing TED and brief, with leg loop trials for shorts with max A, Transfer to rolling shower chair mod A fading to min A   bathing-supervisionl, LB dressing-min A; toilet tranfsers-CGA; toileting-min A   had a EXTRA large BM in shower this am, needs more consistency weith b and b routine and intaske to get on track, family educ and trng ongoing    Mobility   limited participation this week d/t headache and nausea. Up to supervision bed mobility with bed features and leg loops. UP to CGA squat pivot. Initiated family education, w/c eval rescheduled for next week in hopes of improved pt engagement in decision making   mod I w/c level, standing frame x 5 minutes   limited participation d/t illness, w/c eval, pt and family expectations    Communication                Safety/Cognition/ Behavioral Observations               Pain   Pt currently denies pain   Pt will continue to deny pain   Will assess qshift and PRN    Skin   Pt has wound to the back covered with gauze   Pt's wound will heal  Will assess qshift and PRN      Discharge Planning:  Pt is uninsured. Pt has a pending Medicaid application. Pt will d/c to home with his father and his aunt. PRN support from various family members. Family meeting held last week Thursday. Family able to obtain TTB and 3in1 BSC. Fam will be here for w/c eval this week. [facility] referral for diability application submitted. He will be set up for [facility] medication assistance program, and charity HH. SW will confirm there are no barriers to discharge.   Team Discussion: Complete paraplegia. Wants to take bowel medications via mouth. Refuses bowel program. Good BM 03/22/23. Foley in place. Topamax for headache. ? Autonomic dysfunction. N/V Monday. Encouraging BRAT diet. HEP for home.  Patient on target to meet rehab goals: yes, progressing towards goals with discharge date of 04/08/23  *See Care Plan and progress notes for long and  short-term goals.   Revisions to Treatment Plan:  SMOG.  Remove foley later this week.  Teaching Needs: Medications, bowel/bladder management, lovenox teaching, safety, transfer training, etc.   Current Barriers to Discharge: Decreased caregiver support, Neurogenic bowel and bladder, Weight bearing restrictions, and Medication compliance  Possible Resolutions to Barriers: Family education Management of bowel/bladder Compliance with medications/health care Family to obtain medical equipment     Medical Summary Current Status: foley due to false passage/bleeding- finally had gigantic BM- having HA's think it's autonomic dysfunction, not AD-  Barriers to Discharge:  Automomic dysreflexia;Behavior/Mood;Neurogenic Bowel & Bladder;Medical stability;Incontinence;Self-care education;Weight bearing restrictions;Uncontrolled Pain;Other (comments)  Barriers to Discharge Comments: head CT-  (-)- had projectile Vomiting-having daily HA's- off and on- think due to Automic dyfunction- also started on topamax to help HA's- N/V- pending norovirus testing- oin contact precautions- is uninsured- needs HEP- Possible Resolutions to [employer]: w/c eval this week; eating but asked pt to drink supplements, not real food- until cleane dout- might give golytely and SMOG- in TLSO when OOB; d/c- 7/18- right now mod A transfers-veryu limited with therapy due ot nausea/vomiting and HA's. started family education- rescheduling w/c eval.   Continued Need for Acute Rehabilitation Level of Care: The patient requires daily medical management by a physician with specialized training in physical medicine and rehabilitation for the following reasons: Direction of a multidisciplinary physical rehabilitation program to maximize functional independence : Yes Medical management of patient stability for increased activity during participation in an intensive rehabilitation regime.: Yes Analysis of laboratory values and/or radiology reports with any subsequent need for medication adjustment and/or medical intervention. : Yes   I attest that I was present, lead the team conference, and concur with the assessment and plan of the team.   Jearld Adjutant 03/23/2023, 4:05 PM

## 2023-03-23 NOTE — Progress Notes (Signed)
Patient had large BM with therapy this am

## 2023-03-23 NOTE — Progress Notes (Signed)
Physical Therapy Session Note  Patient Details  Name: Lee [name] MRN: [id number] Date of Birth: 04/01/2006  Today's Date: 03/23/2023 PT Missed Time: 30 Minutes Missed Time Reason: Patient unwilling to participate;Pain;Patient fatigue  Short Term Goals: Week 1:  PT Short Term Goal 1 (Week 1): Pt will complete bed mobility with max assist of 1 person PT Short Term Goal 1 - Progress (Week 1): Met PT Short Term Goal 2 (Week 1): Pt will complete slideboard transfer with mod assist of 1 person PT Short Term Goal 2 - Progress (Week 1): Met PT Short Term Goal 3 (Week 1): Pt will tolerate sitting upright in wheelchair for 4 hours outside of therapy PT Short Term Goal 3 - Progress (Week 1): Not met PT Short Term Goal 4 (Week 1): Pt will correctly verbalize frequency and type of seated pressure relief to decrease risk for pressure injuries PT Short Term Goal 4 - Progress (Week 1): Met PT Short Term Goal 5 (Week 1): Pt will manage all WC parts as well as BLEs on/off leg rests with supervision PT Short Term Goal 5 - Progress (Week 1): Met Week 2:  PT Short Term Goal 1 (Week 2): Pt will tolerate sitting up in w/c >4 hours PT Short Term Goal 2 (Week 2): Pt will self propel w/c in community environment PT Short Term Goal 3 (Week 2): Pt will perform bed mobility with supervision and leg loops consistently  Skilled Therapeutic Interventions/Progress Updates:  Pt supine in bed and asleep on entrance to room. Wakes easily and relates that his head is pounding and it hurts to open his eyes. Says he did a lot earlier  and is too fatigued to participate in any mobilizing. Despite education re: need to maintain mobility in order to improve headache pain and all other bodily aches, pt continues to decline but then relates he will participate tomorrow.  Pt missed 30 min of skilled therapy due to headache pain, fatigue. Will re-attempt as schedule and pt availability permits.   Therapy Documentation Precautions:   Precautions Precautions: Fall, Other (comment), Back Precaution Comments: paraplegia, chest tube Required Braces or Orthoses: Spinal Brace Spinal Brace: Thoracolumbosacral orthotic, Applied in supine position Restrictions Weight Bearing Restrictions: No General: PT Amount of Missed Time (min): 30 Minutes PT Missed Treatment Reason: Patient unwilling to participate;Pain;Patient fatigue  Pain:  Relates pounding headache pain that is too much to participate in any therapy this afternoon.   Therapy/Group: Individual Therapy  Loel Dubonnet PT, DPT, CSRS 03/23/2023, 6:18 PM
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
## 2023-03-23 NOTE — Progress Notes (Signed)
Patient ID: Lee [name], male   DOB: [birth year], 17 y.o.   MRN: [id number]  SW went by pt room to provide updates, but pt reported not feeling well. SW will follow-up.   Cecile Sheerer, MSW, LCSWA Office: [phone] Cell: [phone] Fax: [phone]

## 2023-03-23 NOTE — Progress Notes (Signed)
IP Rehab Bowel Program Documentation   Bowel Program Start time 1840   Dig Stim Indicated? Yes  Dig Stim Prior to Suppository or mini Enema X 1   Output from dig stim: Minimal  Ordered intervention: Suppository Yes , mini enema No ,   Repeat dig stim after Suppository or Mini enema  X 1,  Output? Minimal   Bowel Program Complete? Yes  Patient Tolerated? Yes

## 2023-03-23 NOTE — Progress Notes (Signed)
Occupational Therapy Session Note  Patient Details  Name: Lee [name] MRN: [id number] Date of Birth: 2005-10-25  Today's Date: 03/23/2023 OT Individual Time: [id number] 1st Session (extra 15 min), 1300-1330 2nd Session Missed 30 min due to HA  OT Individual Time Calculation (min): 75 min, 30 min, Missed 30 min   Short Term Goals: Week 2:  OT Short Term Goal 1 (Week 2): Pt will complete LB dressing at bed level with Min A with AE as necessary OT Short Term Goal 2 (Week 2): Pt will complete lateral transfers with Min A including transfer board placement and leg management OT Short Term Goal 3 (Week 2): Pt will complete bathing with Min A with AE as necessary  Skilled Therapeutic Interventions/Progress Updates:   Session 1:  Pt reports continuous HA and issues remain with constipation. Pt requesting another shower for relief. GF [name] present TLSO mod A EOB before transfer to rolling shower chair and back as well.  Transfer to rolling shower chair mod A fading to min A Bathing shower level CGA UB bathing and LB bathing with max A. Extra large BM on shower floor- nursing and NT notified as well as team via secured chat. Transferred back to bed at end of session, positioned and handed off to nursing for wound care.   Pain:7/10 headache, after extra large BM and emesis pt reported some un-rated headache relief    Session 2: Pt still supine from after shower this am. Pt continues to report high pain HA 9/10. Pt still laying on all the bath sheets and towels placed prior. OT educated on skin protection and need to maintain lying surfaces smooth. Pt able to roll Bly with min A to remove and straighten underneath. Pt self fed apple sauce and soda, OT provided gentle LB stretches, applied B [name] for positioning and skin protection with education for rationale and purpose with pt remaining resting bed level. Pt then refused further therapy and OOB despite OT strongly encouraging. Care coordination with  PT and left pt bed level with GF bedside, bed exit engaged, needs and nurse call button in reach.    Pain: Pt continues to report high pain HA 9/10. Pt self fed apple sauce and soda, OT provided gentle LB strength with pt remaining resting bed level  Therapy Documentation Precautions:  Precautions Precautions: Fall, Other (comment), Back Precaution Comments: paraplegia, chest tube Required Braces or Orthoses: Spinal Brace Spinal Brace: Thoracolumbosacral orthotic, Applied in supine position Restrictions Weight Bearing Restrictions: No   Therapy/Group: Individual Therapy  [name] 03/23/2023, 7:45 AM

## 2023-03-23 NOTE — Progress Notes (Signed)
IP Rehab Bowel Program Documentation   Bowel Program Start time 1840  Dig Stim Indicated? Yes  Dig Stim Prior to Suppository or mini Enema X-Yes   Output from dig stim: Minimal Ordered intervention: Suppository Yes , mini enema Yes ,   Repeat dig stim after Suppository or Mini enema -Yes  Output? To be completed  Bowel Program Complete? No , handoff given Yes, given to Alona Bene, LPN  Patient Tolerated? To be completed

## 2023-03-23 NOTE — Progress Notes (Signed)
IP Rehab Bowel Program Documentation   Bowel Program Start time 1840   Dig Stim Indicated? Yes  Dig Stim Prior to Suppository or mini Enema X 1   Output from dig stim: Minimal  Ordered intervention: Suppository Yes , mini enema No ,   Repeat dig stim after Suppository or Mini enema  X 1,  Output? Minimal   Bowel Program Complete? Yes  Patient Tolerated? Yes       

## 2023-03-23 NOTE — Progress Notes (Signed)
Physical Therapy Session Note  Patient Details  Name: Lee [name] MRN: [id number] Date of Birth: 10/31/2005  Today's Date: 03/23/2023 PT Individual Time: 2130-8657 PT Individual Time Calculation (min): 42 min   Short Term Goals: Week 1:  PT Short Term Goal 1 (Week 1): Pt will complete bed mobility with max assist of 1 person PT Short Term Goal 1 - Progress (Week 1): Met PT Short Term Goal 2 (Week 1): Pt will complete slideboard transfer with mod assist of 1 person PT Short Term Goal 2 - Progress (Week 1): Met PT Short Term Goal 3 (Week 1): Pt will tolerate sitting upright in wheelchair for 4 hours outside of therapy PT Short Term Goal 3 - Progress (Week 1): Not met PT Short Term Goal 4 (Week 1): Pt will correctly verbalize frequency and type of seated pressure relief to decrease risk for pressure injuries PT Short Term Goal 4 - Progress (Week 1): Met PT Short Term Goal 5 (Week 1): Pt will manage all WC parts as well as BLEs on/off leg rests with supervision PT Short Term Goal 5 - Progress (Week 1): Met Week 2:  PT Short Term Goal 1 (Week 2): Pt will tolerate sitting up in w/c >4 hours PT Short Term Goal 2 (Week 2): Pt will self propel w/c in community environment PT Short Term Goal 3 (Week 2): Pt will perform bed mobility with supervision and leg loops consistently  Skilled Therapeutic Interventions/Progress Updates:  Patient supine in bed on entrance to room. Girlfriend lying beside pt in bed. Patient initially asleep but easily roused by grandmother who informs pt that "therapy is here" and now he will have "to do something". Pt is alert and relates that he continues to suffer from HA pain and nausea. No numerical rating provided. Pt relates at this time he is agreeable to PT session in bed with LB exercises. When asked if he is able to perform stretches on his own, pt states no.   Patient with no pain complaint at start of session. Relates need to mobilize LLE slowly d/t increase in  back pain with L leg movements.   Performed the following stretches with pt in order to maintain joint mobility, muscle/ ligament laxity and overall mobility. SLR, 10sec holds, x10 Bil DKTC, maintaining motion at hip joints only, 30 sec x4 SKTC, maintaining motion at hip only x48min 90/90 hip ER/ IR, 10 sec holds, x10 Bil Hip abd/ add 30 sec holds x6 Bil Ankle DF stretch, x30sec holds x6 Bil Longitudinal oscillations to hip joint with pressure moving down quads and just proximal to ankle joints   Patient supine in bed at end of session with brakes locked, bed alarm set, and all needs within reach. Oriented to time, time of next session with OT after lunch and then again with this PT later in afternoon.    Therapy Documentation Precautions:  Precautions Precautions: Fall, Other (comment), Back Precaution Comments: paraplegia, chest tube Required Braces or Orthoses: Spinal Brace Spinal Brace: Thoracolumbosacral orthotic, Applied in supine position Restrictions Weight Bearing Restrictions: No General:   Vital Signs:   Pain: Pain Assessment Pain Scale: 0-10 Pain Score: 2    Therapy/Group: Individual Therapy  Loel Dubonnet PT, DPT, CSRS 03/23/2023, 12:11 PM

## 2023-03-24 MED ORDER — DOXYCYCLINE HYCLATE 100 MG PO TABS
100.0000 mg | ORAL_TABLET | Freq: Two times a day (BID) | ORAL | Status: AC
  Administered 2023-03-24 – 2023-03-30 (×13): 100 mg via ORAL
  Filled 2023-03-24 (×14): qty 1

## 2023-03-24 MED ORDER — TOPIRAMATE 25 MG PO TABS
25.0000 mg | ORAL_TABLET | Freq: Every day | ORAL | Status: DC
  Administered 2023-03-24 – 2023-04-07 (×15): 25 mg via ORAL
  Filled 2023-03-24 (×15): qty 1

## 2023-03-24 MED ORDER — PROMETHAZINE HCL 12.5 MG PO TABS
12.5000 mg | ORAL_TABLET | Freq: Four times a day (QID) | ORAL | Status: DC | PRN
  Administered 2023-03-31 – 2023-04-08 (×6): 12.5 mg via ORAL
  Filled 2023-03-24 (×9): qty 1

## 2023-03-24 NOTE — Progress Notes (Signed)
Physical Therapy Session Note  Patient Details  Name: Lee [name] MRN: [id number] Date of Birth: May 27, 2006  Today's Date: 03/24/2023 PT Individual Time: 1110-1208 and 1425-1500 PT Individual Time Calculation (min): 58 min and  Short Term Goals: Week 2:  PT Short Term Goal 1 (Week 2): Pt will tolerate sitting up in w/c >4 hours PT Short Term Goal 2 (Week 2): Pt will self propel w/c in community environment PT Short Term Goal 3 (Week 2): Pt will perform bed mobility with supervision and leg loops consistently  Skilled Therapeutic Interventions/Progress Updates: Pt presented in bed agreeable to therapy. Pt states unrated HA but not bad. Pt requesting to try car transfer however advised that simulator was in an inaccessible area currently however pt then requested to go outside. PTA then donned TED hose and socks and threaded pants total A. Pt was provided shirt which he was able to don without assist. Performed rolling L/R with minA to allow for placement of TLSO. With [facility] elevated pt was able to complete supine to sit with CGA and completed lateral scoot transfer with modA due to pt unable to fully clear space between bed and w/c and required assist for safety. Pt's aunt arrived shortly after and informed that she brought a Warehouse manager with electrodes" for pt. Had brief discussion regarding use of e-stim and may or may not be beneficial for pt (in attempts to maintain family's decision regarding not being fully direct regarding diagnosis ie complete spinal cord injury). Also provided brief education regarding use of e-stim and efficacy it would provide at this stage however stated would be willing to try later this pm if pt was willing (as this therapist offered last week but pt was ill). Pt then began to c/o increased HA, checked BP for AD,  BP 130/90 (mildly above average but within normal parameters for pt). SW arrived to provide info regarding FMLA documentation and had brief discussion  with pt and aunt prior to leaving room. Pt then propelled partial distance as pt requested to go outside. Prior to leaving unit pt BP was checked again with BP 128/80 which minimal change in HA intensity. Pt was then handed off to aunt to transport/propel outside with nursing aware and pt in NAD.    Tx2: Pt presented in bed agreeable to attempt e-stim as discussed in previous session. Pt presented in sidelying and stating HA not as intense as earlier session but still present. Discussion possibilities for HA such as bed, pillow, limited OOB time. Pt also c/o increased lateral rib/back pain. Pt was able to point to area of pain and upon palpation noted to be muscular in nature. Upon continued discussion pt indicated that he had been using UE a bit more to reposition and due to illness over past few days was possible could be strain due to overuse. PTA then set up e-stim while Misty Stanley, RT entered room to speak with pt. During e-stim pt was monitored for any ms activation with NMES set up on bilateral quads (large pads set horizontally distally for VMO and RF and proximally for RF primarily). Use of InTENSity select Combo ll for e-stim for NMES with electrodes ramped up to 82 (out of 100) 20sec on 10 sec off for 19 min. Pt noted to have no ms activation except for a slight ms twitch on LLE. Pt did have several sensory occurrences including hot at electorde site and feeling of "the foot moving" per pt however no additional sensations. Pt was able  to feel electrodes being removed on LLE at end of session. Pt was positioned in sidelying at end of session with pillow between BLE for support, call bell within reach and needs met.                               Therapy Documentation Precautions:  Precautions Precautions: Fall, Other (comment), Back Precaution Comments: paraplegia, chest tube Required Braces or Orthoses: Spinal Brace Spinal Brace: Thoracolumbosacral orthotic, Applied in supine  position Restrictions Weight Bearing Restrictions: No General:   Vital Signs: Therapy Vitals Temp: 97.8 F (36.6 C) Pulse Rate: 68 Resp: 17 BP: 131/65 Patient Position (if appropriate): Lying Oxygen Therapy SpO2: 100 % O2 Device: Room Air   Therapy/Group: Individual Therapy  Dennisha Mouser 03/24/2023, 4:17 PM
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
## 2023-03-24 NOTE — Progress Notes (Signed)
Bowel program initiated. Nurse explained procedure to patient and patient verbalized consent. Patient turned to left side. Digital stimulation performed for 30 seconds and suppository placed. No bowel movement at this time. Bed lowered and call bell within reach. Oncoming nurse made aware that bowel program was initiated-confirmed understanding.
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
## 2023-03-24 NOTE — Progress Notes (Signed)
Pt had emesis while having a bowel movement.  He wanted a note made that he had large bowel movement.  Pt given a bed bath instead of a CHG bath.

## 2023-03-24 NOTE — Progress Notes (Signed)
Patient ID: Lee [name], male   DOB: 05-23-2006, 17 y.o.   MRN: [id number]  SW met with pt and pt aunt in room to discuss FMLA forms that were sent by his mother. SW informed no FMLA forms received. Still unsure on when next w/c eval will be.   *Later received FMLA form but unable to open. SW informed his mother will need to resend.   Cecile Sheerer, MSW, LCSWA Office: [phone] Cell: [phone] Fax: [phone]

## 2023-03-24 NOTE — Progress Notes (Signed)
Occupational Therapy Session Note  Patient Details  Name: Lee [name] MRN: [id number] Date of Birth: 2006/04/03  Today's Date: 03/24/2023 OT Individual Time: 1191-4782, 1300-1400 OT Individual Time Calculation (min): 75 min, 60 min    Short Term Goals: Week 2:  OT Short Term Goal 1 (Week 2): Pt will complete LB dressing at bed level with Min A with AE as necessary OT Short Term Goal 2 (Week 2): Pt will complete lateral transfers with Min A including transfer board placement and leg management OT Short Term Goal 3 (Week 2): Pt will complete bathing with Min A with AE as necessary  Skilled Therapeutic Interventions/Progress Updates:    Session 1:  Pt open to am OT session with more animation and smiles this am. Pt reports persistent HA but eager for shower, therapy for the day and to progress as per subjective statements throughout session. No family present this session with OT encouraging directing care to drive pt's independence level as much as possible. Pt requested TLSO in supine to ease back pain with log roll technique. Supine to sit with mod A for LE mngt, EOB min A fading to CGA to set up transfer. Pop over lateral transfer to/from rolling shower chair overall mod A with + buttocks clearance. OT managed chair in and out of stall shower. Pt able to utilize [facility] shower hose, LH sponge, increase initiation for item mngt overall this session as well as lower body bathing. UB bathing with CGA due to trunk control and max a LB bathing with LH sponge. TLSO re-applied for lateral transfer back to bed. Once back bed level, pt requested sidelying for comfort with OT placing pillow props for support. Pt requested TED's to be applied later. Left pt bed level with needs and nurse call button in reach and bed exit in engaged.     Pain: 8/10 HA and 6/10 back pain with warm shower, rest, pillow props, TLSO use and repositioning for relief. HA down to 5/20 at end of session      Session 2:  Pt  seen for skilled OT session with focus on transfers to mat in gym for UE strength and trunk control. Pt agreeable despite HA and back pain. OT provided sunglasses for light sensitivity. OT assisted to apply TLSO in supine. Supine to sit with mod a for LE mngt and use of rail and bed features. Lateral transfer off/on bed to w/c with mod A with use of leg loops for mngt to foot plates. Pt self propelled 15 ft then reported back pain. Once in gym, pt transferred to mat with mod A. Reported back pain worsening. Returned back to chair, room and bed. Repositioned to side lying with pillow props. Nursing aware and contacting MD due to increased back pain. Left pt with moist heat to back with skin barrier and educated to remove in 20 minutes. All needs and safety measures in place.    Pain: back pain 5/10 resting side lying upon OT arrival, once in sitting with TLSO back pain more sharp 8/10 and remained until back to bed in sidelying, nursing informed and will contact MD     Therapy Documentation Precautions:  Precautions Precautions: Fall, Other (comment), Back Precaution Comments: paraplegia, chest tube Required Braces or Orthoses: Spinal Brace Spinal Brace: Thoracolumbosacral orthotic, Applied in supine position Restrictions Weight Bearing Restrictions: No   Therapy/Group: Individual Therapy  [name] 03/24/2023, 7:44 AM

## 2023-03-24 NOTE — Progress Notes (Signed)
Spoke with family no NOROVIRUS identified with any family members after  testing  Will lift restrictions

## 2023-03-25 ENCOUNTER — Inpatient Hospital Stay (HOSPITAL_COMMUNITY): Payer: Medicaid Other

## 2023-03-25 MED ORDER — METHOCARBAMOL 500 MG PO TABS: 500.0000 mg | ORAL_TABLET | Freq: Four times a day (QID) | ORAL | Status: DC | PRN

## 2023-03-25 NOTE — Progress Notes (Signed)
Bowel program initiated by day shift RN. No BM as at documentation. Patient declined to have further digital stimulation. Claimed he had BM earlier. Requested to have a male staff attend to him should he have a BM. Same agreed to. Safety maintained at all times.
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
## 2023-03-25 NOTE — Progress Notes (Signed)
Had a small BM this morning.

## 2023-03-25 NOTE — Progress Notes (Signed)
Patient called to be repositioned in bed. Girlfriend in bed with patient. Asked if she could exit bed to have patient repositioned. She agreed and patient was repositioned in bed. Claimed he feel pain from the foley possibly due to lady sleeping over it. Will have management about patient and bed policy.

## 2023-03-25 NOTE — Progress Notes (Signed)
IP Rehab Bowel Program Documentation   Bowel Program Start time [phone]   Dig Stim Indicated? Yes  Dig Stim Prior to Suppository or mini Enema X 1   Output from dig stim: None  Ordered intervention: Suppository Yes , mini enema No ,   Repeat dig stim after Suppository or Mini enema  X 0 ,  Output? none   Bowel Program Complete? No , handoff given Night RN  Patient Tolerated? Yes

## 2023-03-25 NOTE — Progress Notes (Signed)
Physical Therapy Session Note  Patient Details  Name: Lee [name] MRN: [id number] Date of Birth: 2006/06/23  Today's Date: 03/25/2023 PT Individual Time: 4098-1191 + 4782-9562 PT Individual Time Calculation (min): 41 min + 42 min  Short Term Goals: Week 2:  PT Short Term Goal 1 (Week 2): Pt will tolerate sitting up in w/c >4 hours PT Short Term Goal 2 (Week 2): Pt will self propel w/c in community environment PT Short Term Goal 3 (Week 2): Pt will perform bed mobility with supervision and leg loops consistently  Skilled Therapeutic Interventions/Progress Updates:   SESSION 1: Pt presents in room in bed asleep with GF in bed with pt. Pt requires increased time to wake with verbal cues, denies pain and agreeable to PT. Pt denies nausea or HA but reports not sleeping well last night. Session focused on education for tolerating OOB, bed mobility, transfer training, and WC mobility.  Pt requires max assist for donning pants in supine with pt completing rolling with min assist with hospital bed rails. Pt dons TLSO in supine by rolling bilaterally. Therapist dons leg loops bilaterally total assist. Pt attempts supine to sit but unable to reach leg loops. Therapist instructs pt in completing supine to long sitting with pt able to achieve long sitting by propping to elbows and propping to hands. Pt reporting increase pain in back with long sitting. Therapist educates pt on muscle tightness and importance of stretching to be able to achieve ring sitting for managing BLEs for dressing and mobility with pt verbalizing understanding.  Pt requires min assist for supine to sit EOB and CGA to supervision for seated balance EOB. Pt completes slideboard transfer, assist for placement of slideboard, with CGA from bed to WC. Pt then completes WC mobility from room to central nursing station and down midwest hallway, >500' with verbal cues for efficiency with propulsion (push and let go) as well as instructing pt on  managing making fast turns while propelling. Pt completes propulsion with supervision and returns back to room.  Pt agreeable to staying up in [facility] between therapy sessions, educated on requesting assist from RN if needing to return to bed with pt verbalizing understanding. Pt able to verbalize how frequently to complete pressure relief while seated in [facility] and how many times to complete prior to next therapy session with pt verbalizing correct number. Pt remains seated with family present in room at end of session.    SESSION 2: Pt presents in room in [facility], denies pain and agreeable to PT. Pt verbalized that he has completed one round of pressure relief and demonstrates effective pressure relief with chair press up method and holds for 30 seconds. Session focused on NMR for core strengthening, decreasing muscle tightness, and seated balance needed for functional transfers, mobility, and self care tasks as well as transfer training and bed mobility training.  Pt self propels from room to main gym with distant supervision demonstrating good carryover of cues from previous session. Pt positions for transfer with pt able to manage legs and leg rests with verbal cues only. Pt completes lateral transfer WC to mat with CGA demonstrating good gluteal clearance. Pt able to transition from short sitting EOM to long sitting with mod assist x2 with pt able to manage BLEs onto mat with assist to prevent falling off of mat. Pt able to scoot laterally in long sitting towards middle of mat and therapist positions posterior to pt seated on theraball.  Pt completes NMR for achieving figure 4  and ring sitting position to promote improved core stability, balance, and mobility needed to achieve position including: - Modified figure 4 LLE holding 3x30-60 seconds - Modified figure 4 RLE 2x30 seconds (increased tightness with RLE) - modified figure 4 BLE weightshifting BUEs placing unilateral UE between BLEs, alternating x10 -  Ring sitting propped with BUEs posterior to pt 3x30sec - Ring sitting with forearms propped on elbows 3x30 seconds  Pt tolerates all above positions with reports of tightness in back that subsides with rest between holding positions. Pt demonstrating one instance mild spasm in RLE with activities. Pt reporting increasing feeling in RLE in quadriceps reporting feeling tightness.  Pt transitions from ring sitting to supine with min/mod assist for controlled lowering onto elbow propped and then to supine. Pt then completes supine to short sitting EOM with CGA demonstrating propping to L elbow and using RUE to manage BLEs off mat with leg loops. Pt completes lateral transfer back to [facility] CGA with assist for BLE placement. Pt requires min assist for managing WC parts and managing of BLEs onto leg rests.  Pt self propels in WC back to room and remains seated upright in [facility] with all needs within reach, call light in place and GF present at end of session.   Therapy Documentation Precautions:  Precautions Precautions: Fall, Other (comment), Back Precaution Comments: paraplegia, chest tube Required Braces or Orthoses: Spinal Brace Spinal Brace: Thoracolumbosacral orthotic, Applied in supine position Restrictions Weight Bearing Restrictions: No   Therapy/Group: Individual Therapy  Edwin Cap PT, DPT 03/25/2023, 3:35 PM

## 2023-03-25 NOTE — Progress Notes (Signed)
Physical Therapy Weekly Progress Note  Patient Details  Name: Lee [name] MRN: [id number] Date of Birth: 29-Sep-2005  Beginning of progress report period: March 19, 2023 End of progress report period: March 25, 2023  Today's Date: 03/25/2023 PT Individual Time: 0910-1000 PT Individual Time Calculation (min): 50 min   Patient has met 3 of 3 short term goals.  Pt is progressing well toward LTGs. He has been limited by nausea and headaches for several dates, but had greatly improved participation on this date. Pt is able to perform CGA transfers with therapy and is using transfer board with nsg and family members. Will continue family education as appropriate.   Patient continues to demonstrate the following deficits muscle joint tightness and muscle paralysis, decreased cardiorespiratoy endurance, and decreased sitting balance, decreased postural control, decreased balance strategies, and difficulty maintaining precautions and therefore will continue to benefit from skilled PT intervention to increase functional independence with mobility.  Patient progressing toward long term goals..  Continue plan of care.  PT Short Term Goals Week 2:  PT Short Term Goal 1 (Week 2): Pt will tolerate sitting up in w/c >4 hours PT Short Term Goal 1 - Progress (Week 2): Met PT Short Term Goal 2 (Week 2): Pt will self propel w/c in community environment PT Short Term Goal 2 - Progress (Week 2): Met PT Short Term Goal 3 (Week 2): Pt will perform bed mobility with supervision and leg loops consistently PT Short Term Goal 3 - Progress (Week 2): Met Week 3:  PT Short Term Goal 1 (Week 3): Pt will perform bed/chair transfers with supervision PT Short Term Goal 2 (Week 3): Pt will participate in high level w/c skills training (wheelies, etc.) PT Short Term Goal 3 (Week 3): Pt will perform sit>supine with CGA or better  Skilled Therapeutic Interventions/Progress Updates:    pt received in bed and agreeable to therapy.  Pt reports improved HA pain this AM, however, reports R sided rib/flank pain that worsened in supine with breathing. MD/PA made aware d/t possibility of at level SCI pain, however pt's description is inconsistent with this type of pain.   Pt Donned ted hose with assist in supine, supine>sit with assist for BLE management. Max A to don TLSO. Pt then performed squat pivot to w/c with CGA. Pt propelled w/c with BUE throughout session for endurance and functional mobility.   Pt requested to use e stim for quad activation, but appropriate unit not available. Available unit did provide enough output to produce similar results to previous day's session. Instead transferred to mat table for LE stretching. Pt able to transition into supine with cueing and min A for each LE to pull onto mat table. During stretching, pt reports an increase in R sided pain which necessitated max  A to return to sitting. Pt returned to chair and to room, remained seated in w/c at end of session with all needs in reach.   Therapy Documentation Precautions:  Precautions Precautions: Fall, Other (comment), Back Precaution Comments: paraplegia, chest tube Required Braces or Orthoses: Spinal Brace Spinal Brace: Thoracolumbosacral orthotic, Applied in supine position Restrictions Weight Bearing Restrictions: No General:   Vital Signs:   Pain:   Vision/Perception     Mobility:   Locomotion :    Trunk/Postural Assessment :    Balance:   Exercises:   Other Treatments:     Therapy/Group: Individual Therapy  [name] 03/25/2023, 12:42 PM

## 2023-03-25 NOTE — Progress Notes (Signed)
Pt continues to refuse medications throughout the day regardless of education on medications. Pt continues to state " Im not in pain so ill wait til later". Woman found laying in bed with patient through out day. Pt allowed nurse to perform x1 digstim along with foleycare but still refused CHG.

## 2023-03-25 NOTE — Progress Notes (Signed)
Occupational Therapy Session Note  Patient Details  Name: Lee [name] MRN: [id number] Date of Birth: [birth year]  Today's Date: 03/25/2023 OT Individual Time: 1015-1130 OT Individual Time Calculation (min): 75 min    Short Term Goals: Week 2:  OT Short Term Goal 1 (Week 2): Pt will complete LB dressing at bed level with Min A with AE as necessary OT Short Term Goal 2 (Week 2): Pt will complete lateral transfers with Min A including transfer board placement and leg management OT Short Term Goal 3 (Week 2): Pt will complete bathing with Min A with AE as necessary  Skilled Therapeutic Interventions/Progress Updates:   Pt up and dressed for therapy. Still requesting shower. OT will train GF and clear for shower assist so to be available for other therapy moving forward. Plan for transfer trng GF tomorrow on rolling shower chair with NT present. Pt transferred to bed  Pop over lateral transfer to/from rolling shower chair overall mod A with + buttocks clearance. OT managed chair in and GF managed out of stall shower. GF encouraged to allow for and pt reinforced to use  HH shower hose, LH sponge, increase initiation for item mngt as well as lower body bathing. UB bathing with CGA due to trunk control and mod-max a LB bathing with LH sponge. TLSO re-applied for lateral transfer back to bed with OT providing mod A and GF stand by for training. Rolling for brief application with max A to don. Pt left bed level with bed exit engaged, needs and nurse call button in reach.   Pain:   Denies all pain this visit including back and HA   Therapy Documentation Precautions:  Precautions Precautions: Fall, Other (comment), Back Precaution Comments: paraplegia, chest tube Required Braces or Orthoses: Spinal Brace Spinal Brace: Thoracolumbosacral orthotic, Applied in supine position Restrictions Weight Bearing Restrictions: No    Therapy/Group: Individual Therapy  [name] 03/25/2023, 12:20 PM

## 2023-03-25 NOTE — Progress Notes (Signed)
Patient vomited (tiny amount per NT) after having his night medications. Claimed it was because he took medication on empty stomach. Requested to have fruits and same served. Vital sign rechecked. Verbalized he is doing okay and wanted to sleep. Safety maintained.

## 2023-03-26 LAB — CBC WITH DIFFERENTIAL/PLATELET
Abs Immature Granulocytes: 0.05 10*3/uL (ref 0.00–0.07)
Basophils Absolute: 0.1 10*3/uL (ref 0.0–0.1)
Basophils Relative: 1 %
Eosinophils Absolute: 0.4 10*3/uL (ref 0.0–0.5)
Eosinophils Relative: 5 %
HCT: 35.8 % — ABNORMAL LOW (ref 39.0–52.0)
Hemoglobin: 11.3 g/dL — ABNORMAL LOW (ref 13.0–17.0)
Immature Granulocytes: 1 %
Lymphocytes Relative: 33 %
Lymphs Abs: 2.4 10*3/uL (ref 0.7–4.0)
MCH: 28 pg (ref 26.0–34.0)
MCHC: 31.6 g/dL (ref 30.0–36.0)
MCV: 88.6 fL (ref 80.0–100.0)
Monocytes Absolute: 0.7 10*3/uL (ref 0.1–1.0)
Monocytes Relative: 10 %
Neutro Abs: 3.7 10*3/uL (ref 1.7–7.7)
Neutrophils Relative %: 50 %
Platelets: 368 10*3/uL (ref 150–400)
RBC: 4.04 MIL/uL — ABNORMAL LOW (ref 4.22–5.81)
RDW: 13.3 % (ref 11.5–15.5)
WBC: 7.3 10*3/uL (ref 4.0–10.5)
nRBC: 0 % (ref 0.0–0.2)

## 2023-03-26 LAB — BASIC METABOLIC PANEL
Anion gap: 9 (ref 5–15)
BUN: 18 mg/dL (ref 6–20)
CO2: 29 mmol/L (ref 22–32)
Calcium: 9.4 mg/dL (ref 8.9–10.3)
Chloride: 98 mmol/L (ref 98–111)
Creatinine, Ser: 0.87 mg/dL (ref 0.61–1.24)
GFR, Estimated: >60 mL/min (ref >60)
Glucose, Bld: 100 mg/dL — ABNORMAL HIGH (ref 70–99)
Potassium: 3.8 mmol/L (ref 3.5–5.1)
Sodium: 136 mmol/L (ref 135–145)

## 2023-03-26 LAB — NOROVIRUS GROUP 1 & 2 BY PCR, STOOL
Norovirus 1 by PCR: NEGATIVE
Norovirus 2  by PCR: NEGATIVE

## 2023-03-26 NOTE — Progress Notes (Addendum)
Taught patient's aunt, Anselm Lis, bowel program. Patient was partially able to verbalize steps in program to aunt. Then aunt performed bowel program including dig stim, suppository placement. Also performed peri care for patient, placed brief and did foley care using wipes. Discussed reasons behind these procedures. Patient had questions about intermittent cath vs foley and is agreeable to resuming intermittent cath Monday so he can practice before goes home.   Patient had been incontinent of stool after eating early dinner prior to bowel program. Small amount. First dig stim yielded small amount liquid stool. Completion of bowel program - more dig stim, yielded same small amount of mushy stool.   IP Rehab Bowel Program Documentation   Bowel Program Start time 1730  Dig Stim Indicated? Yes  Dig Stim Prior to Suppository or mini Enema X 3   Output from dig stim: Small  Ordered intervention: Suppository Yes , mini enema No ,   Repeat dig stim after Suppository or Mini enema  X 4,  Output? Small   Bowel Program Complete? Yes , handoff given Ebony  Patient Tolerated? Yes
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
## 2023-03-26 NOTE — Progress Notes (Signed)
Occupational Therapy Weekly Progress Note  Patient Details  Name: Lee [name] MRN: [id number] Date of Birth: 02/11/2006  Beginning of progress report period: March 19, 2023 End of progress report period: March 26, 2023  Today's Date: 03/26/2023 OT Individual Time: 1000-1045, missed 15 min d/t fatigue  OT Individual Time Calculation (min): 45 min    Patient has progressed toward 3 of 3 short term goals with 2 new STG's added. Progress strong when able to fully participate. Pt has had multiple days with bowel issues, vomiting, HA pain and back pain limiting full participation in OT. Family involved, SCI education initiated including custom manual seating, skin protection, B & B education and comprehensive ADL and transfer training. OT feels with more consistency with pt's participation in all therapy sessions, pt will achieve S/LTG's set in CIR.   Patient continues to demonstrate the following deficits: muscle weakness, muscle joint tightness, and muscle paralysis, decreased cardiorespiratoy endurance, impaired timing and sequencing, abnormal tone, and unbalanced muscle activation, and decreased sitting balance, decreased standing balance, decreased postural control, decreased balance strategies, and difficulty maintaining precautions and therefore will continue to benefit from skilled OT intervention to enhance overall performance with BADL, iADL, School/Education, Vocation, and Reduce care partner burden.  Patient progressing toward long term goals..  Continue plan of care.  OT Short Term Goals Week 2:  OT Short Term Goal 1 (Week 2): Pt will complete LB dressing at bed level with Min A with AE as necessary OT Short Term Goal 1 - Progress (Week 2): Progressing toward goal OT Short Term Goal 2 (Week 2): Pt will complete lateral transfers with Min A including transfer board placement and leg management OT Short Term Goal 2 - Progress (Week 2): Progressing toward goal OT Short Term Goal 3 (Week 2):  Pt will complete bathing with Min A with AE as necessary OT Short Term Goal 3 - Progress (Week 2): Progressing toward goal Week 3:  OT Short Term Goal 1 (Week 3): Pt will self initiate all aspects of UB self care and complete with close S only OT Short Term Goal 2 (Week 3): Family/caregiver will demonstrate safety and competency for shower chair access with 1 staff member SBA and pt self directing  Skilled Therapeutic Interventions/Progress Updates:    Pt bed level asleep upon OT arrival. Earlier, this clinician had prepared pt and GF for plan to perform shower transfer training Family Education with pt in agreement. Upon OT arrival, pt declined and requested bed level session due to eye and HA pain. See below. Skin protection and pressure relief education, skin inspection with adapted mirror, optimal body postioning, effective bed mobility training with NT and OT for LB self care. Encouraged pt to direct care including Foley care and body management. Leg loops doffed, TEDs applied and pillows positioned pt in side lying. Missed 15 min due to pain and fatigue and will attempt to make up as able  Pain: reports HA still in L eye, nursing made aware, repositioning and rest at end of session for relief   Therapy Documentation Precautions:  Precautions Precautions: Fall, Other (comment), Back Precaution Comments: paraplegia, chest tube Required Braces or Orthoses: Spinal Brace Spinal Brace: Thoracolumbosacral orthotic, Applied in supine position Restrictions Weight Bearing Restrictions: No  Therapy/Group: Individual Therapy  [name] 03/26/2023, 7:43 AM

## 2023-03-27 NOTE — Progress Notes (Signed)
Occupational Therapy Session Note  Patient Details  Name: Aaqil Massucci MRN: [id number] Date of Birth: 2005-10-05  Today's Date: 03/27/2023 OT Individual Time: 4098-1191 OT Individual Time Calculation (min): 30 min    Short Term Goals: Week 1:  OT Short Term Goal 1 (Week 1): Pt will perform long sit mat level with B UE support in prep for LB dressing with CGA OT Short Term Goal 1 - Progress (Week 1): Met OT Short Term Goal 2 (Week 1): Pt will transfer to most appropriate shower seating DME with TB and mod A OT Short Term Goal 2 - Progress (Week 1): Met OT Short Term Goal 3 (Week 1): Pt will sit EOB with CGA for BADL's with min A OT Short Term Goal 3 - Progress (Week 1): Met  Skilled Therapeutic Interventions/Progress Updates: Patient grandmother, 79 year old little sister, and grandmother's fiance were present for the session.  Patient and grandmother were educated on sliding board transfers and figure 4 on bed to don/doff shorts.    He was able to complete w/chair to bed sliding board transfer with Close Supervision, this clinician standing close by in front.   He was able to position wheelchair, sliding board, and his feet in safe positions.   Patient elected not to use leg lifters and exhibited strength to don shorts without use of lifters.  He preferred this method.   Though he stated he had never thredded his catheter through his shorts and that hospital staff normally does that, he was willing to try in order to increase independence once catheter bag was placed at side of bed within his reach so that he could thred it through his short.  Otherwise, he was able to complete donning and doffing the shorts with moderate assistance for leg placement so that he could roll like a log (adhering to back precautions) and afterwards reached around to pull up his shorts in back.  Of note, patient elected to put his left leg through his shorts before thredding the catheter bag because he stated that  he did not want to take a chance on pressing on his catheter bag with his foot and causing it to leek.   He smiled and stated that he learned and taught himself something new today by doing so.  At the end of the session, the patient was left in the supportive care of his grandmother, with his little 84 year old sister and grandmother's fiance present.  Continue patient's Plan of Care.       Therapy Documentation Precautions:  Precautions Precautions: Fall, Other (comment), Back Precaution Comments: paraplegia, chest tube Required Braces or Orthoses: Spinal Brace Spinal Brace: Thoracolumbosacral orthotic, Applied in supine position Restrictions Weight Bearing Restrictions: No General: General OT Amount of Missed Time: 15 Minutes  Pain: 5/10 low back right over left hip aching.  Patient voiced no need for meds and educated to inform nurse if pain level goes higher and he feels he needs medication or other relief.      Therapy/Group: Individual Therapy  Bud [facility] 03/27/2023, 5:05 PM

## 2023-03-27 NOTE — Progress Notes (Addendum)
Physical Therapy Session Note  Patient Details  Name: Lee [name] MRN: [id number] Date of Birth: 04-03-2006  Today's Date: 03/27/2023 PT Individual Time: 1220-1300 PT Individual Time Calculation (min): 40 min   Short Term Goals: Week 2:  PT Short Term Goal 1 (Week 2): Pt will tolerate sitting up in w/c >4 hours PT Short Term Goal 1 - Progress (Week 2): Met PT Short Term Goal 2 (Week 2): Pt will self propel w/c in community environment PT Short Term Goal 2 - Progress (Week 2): Met PT Short Term Goal 3 (Week 2): Pt will perform bed mobility with supervision and leg loops consistently PT Short Term Goal 3 - Progress (Week 2): Met  Skilled Therapeutic Interventions/Progress Updates:    Pt seen at scheduled therapy time (11:15), but pt was sound asleep and not easily roused. Therapist requested pt and girlfriend dress and get ready for therapy, pt agreed and therapist returned after 1 hour. On return, pt was awake, but not dressed and with [name] still in place. Pt requesting shower. Since pt's girlfriend is checked off for transfers and shower, pt was set up for this at end of session.   Donned ted hose, pants, and brace tot A in supine, shirt with set up A. Supine>sit with mod A for BLE management and cueing for technique. Pt then performed slideboard transfer with close supervision to rigid frame w/c for trial.   Trialled rigid frame ultra light for transfer practice with rigid foot plate. While this  chair will require adjustment for improved axle position to prevent tippiness and reduce dump, pt with good positioning and safety with anti tippers in place. W/c propulsion including race with girlfriend on rolling stool for improved participation and social engagement.   Returned to room and performed slideboard transfer chair>EOB>shower chair with assist from girlfriend, both demo good safety. Informed NT that they would be showering and requested check ins to ensure safety.   Therapy  Documentation Precautions:  Precautions Precautions: Fall, Other (comment), Back Precaution Comments: paraplegia, chest tube Required Braces or Orthoses: Spinal Brace Spinal Brace: Thoracolumbosacral orthotic, Applied in supine position Restrictions Weight Bearing Restrictions: No General:       Therapy/Group: Individual Therapy  [name] 03/27/2023, 1:23 PM
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
## 2023-03-28 MED ORDER — TROLAMINE SALICYLATE 10 % EX CREA: TOPICAL_CREAM | Freq: Two times a day (BID) | CUTANEOUS | Status: DC | PRN

## 2023-03-28 NOTE — Progress Notes (Signed)
[facility] Rehab Bowel Program Documentation   Bowel Program Start time [phone]  Dig Stim Indicated? Yes  Dig Stim Prior to Suppository or mini Enema X 1   Output from dig stim: Moderate  Ordered intervention: Suppository Yes , mini enema No ,   Repeat dig stim after Suppository or Mini enema  X 2,  Output? Small   Bowel Program Complete? Yes , handoff given   Patient Tolerated? Yes
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
## 2023-03-29 MED ORDER — SORBITOL 70 % SOLN
90.0000 mL | Freq: Once | Status: AC
  Administered 2023-03-29: 90 mL via ORAL
  Filled 2023-03-29: qty 90

## 2023-03-29 NOTE — Progress Notes (Signed)
Occupational Therapy Session Note  Patient Details  Name: Lee [name] MRN: [id number] Date of Birth: 04-Jul-2006  Today's Date: 7/52024 OT Individual Time:  - 1130-1200 ( ) late entry      Short Term Goals: Week 1:  OT Short Term Goal 1 (Week 1): Pt will perform long sit mat level with B UE support in prep for LB dressing with CGA OT Short Term Goal 1 - Progress (Week 1): Met OT Short Term Goal 2 (Week 1): Pt will transfer to most appropriate shower seating DME with TB and mod A OT Short Term Goal 2 - Progress (Week 1): Met OT Short Term Goal 3 (Week 1): Pt will sit EOB with CGA for BADL's with min A OT Short Term Goal 3 - Progress (Week 1): Met Week 2:  OT Short Term Goal 1 (Week 2): Pt will complete LB dressing at bed level with Min A with AE as necessary OT Short Term Goal 1 - Progress (Week 2): Progressing toward goal OT Short Term Goal 2 (Week 2): Pt will complete lateral transfers with Min A including transfer board placement and leg management OT Short Term Goal 2 - Progress (Week 2): Progressing toward goal OT Short Term Goal 3 (Week 2): Pt will complete bathing with Min A with AE as necessary OT Short Term Goal 3 - Progress (Week 2): Progressing toward goal Week 3:  OT Short Term Goal 1 (Week 3): Pt will self initiate all aspects of UB self care and complete with close S only OT Short Term Goal 2 (Week 3): Family/caregiver will demonstrate safety and competency for shower chair access with 1 staff member SBA and pt self directing  Skilled Therapeutic Interventions/Progress Updates:    1:1 Pt received in the bed and asleep. Took a while to wake up.  Pt initially agreed to don clothing. With [facility] elevated for an upright seated position; leg loops donned. Practiced figure four position using leg loops to thread pants. Pt able to thread left LE and then decided he didn't want to get dressed. Pt shared that he was going back to [place] after here.  Pt became tearful and was  emotional about situation. Provided a space to have emotions. Pt left in bed to rest.   Therapy Documentation Precautions:  Precautions Precautions: Fall, Other (comment), Back Precaution Comments: paraplegia, chest tube Required Braces or Orthoses: Spinal Brace Spinal Brace: Thoracolumbosacral orthotic, Applied in supine position Restrictions Weight Bearing Restrictions: No General:   Vital Signs: Therapy Vitals Temp: 98.2 F (36.8 C) Pulse Rate: 94 Resp: 16 BP: 126/79 Patient Position (if appropriate): Lying Oxygen Therapy SpO2: 99 % O2 Device: Room Air Pain:  Reported pain in left eye  Therapy/Group: Individual Therapy  Roney Mans [facility] 03/29/2023, 8:04 AM

## 2023-03-29 NOTE — Progress Notes (Signed)
Physical Therapy Session Note  Patient Details  Name: Lee [name] MRN: [id number] Date of Birth: Dec 04, 2005  Today's Date: 03/29/2023 PT Individual Time: 1030-1100, 1400-1445 PT Individual Time Calculation (min): 30 min, 45 min   Short Term Goals: Week 3:  PT Short Term Goal 1 (Week 3): Pt will perform bed/chair transfers with supervision PT Short Term Goal 2 (Week 3): Pt will participate in high level w/c skills training (wheelies, etc.) PT Short Term Goal 3 (Week 3): Pt will perform sit>supine with CGA or better  Skilled Therapeutic Interventions/Progress Updates:    Session 1: pt recd with OT assisting with dressing. Pt reports stomach pain, managed with positioning. Session focused on w/c eval with Fleet Contras, [facility] and Valetta Fuller, OT. Discussed appropriate seating for pt's needs and preferences with decision on rigid frame chair with swing back arm rests and hybrid roho cushion. Pt requesting more supportive back rest at this time. slideboard transfer EOB<>w/c with close supervision. Sit>supine with close supervision. Therapist then adjusted chair to reduce dump and decrease tippiness for safety. Pt was left with all needs in reach and alarm active.   Session 2: pt received in bed and agreeable to therapy. Intermittent pain with mobility, improved with positioning. Donned brace with set up and supine<>sit with supervision.  Supervision slideboard transfer with pt demoing good safety.   Session focused on advanced w/c mobility, including wheelies both stationary and moving. Several attempts at popping casters onto 4" step. Improving stability with repetition by end of session.   Pt also requesting to stand. Attempted with various methods, but without success d/t LE weakness. Pt is able to compensate with UE, but with tendency to lift entire body off floor.   Pt returned to bed at end of session and was left with all needs in reach and alarm active.   Therapy  Documentation Precautions:  Precautions Precautions: Fall, Other (comment), Back Precaution Comments: paraplegia, chest tube Required Braces or Orthoses: Spinal Brace Spinal Brace: Thoracolumbosacral orthotic, Applied in supine position Restrictions Weight Bearing Restrictions: No General:    Therapy/Group: Individual Therapy  [name] 03/29/2023, 3:56 PM

## 2023-03-29 NOTE — Progress Notes (Signed)
Physical Therapy Session Note  Patient Details  Name: Lee [name] MRN: [id number] Date of Birth: 01-15-2006  Today's Date: 03/29/2023 PT Individual Time: 1300-1347 PT Individual Time Calculation (min): 47 min   Short Term Goals: Week 3:  PT Short Term Goal 1 (Week 3): Pt will perform bed/chair transfers with supervision PT Short Term Goal 2 (Week 3): Pt will participate in high level w/c skills training (wheelies, etc.) PT Short Term Goal 3 (Week 3): Pt will perform sit>supine with CGA or better  Skilled Therapeutic Interventions/Progress Updates:      Therapy Documentation Precautions:  Precautions Precautions: Fall, Other (comment), Back Precaution Comments: paraplegia, chest tube Required Braces or Orthoses: Spinal Brace Spinal Brace: Thoracolumbosacral orthotic, Applied in supine position Restrictions Weight Bearing Restrictions: No   Pt received semi-reclined in bed, agreeable to PT session with emphasis on high level w/c skills (wheelies and curb navigation). Pt (S) with bed mobility and for slide board transfer to w/c. Pt propelled w/c ~300 ft to rehab hallway.   PT removed anti-tippers and positioned pt backwards in w/c in wheelie position to increase body awareness and spatial orientation. PT instructed and progressed with patient to min/mod A for upward placement of front casters on 4 inch curb step.   Pt returned to room and agreeable to try sit to supine with HOB lower than 90 degrees and pt requires min A for trunk control.   Pt without reports of pain and states eye pain has improved. Pt left semi-reclined in bed with all needs in reach.   Therapy/Group: Individual Therapy  Truitt Leep 03/29/2023, 5:59 AM

## 2023-03-29 NOTE — Progress Notes (Addendum)
Patient refused dig stimulation this evening.Patient refused CHG bath, however will let staff complete foley care. He did take the ordered sorbitol. Educated him on eating his cherries which with help with constipation. Also educated patient to drink more water, and less soda.

## 2023-03-29 NOTE — Progress Notes (Signed)
Pt crying c/o left lower quadrant pain. Palpated abdomen in all four quadrants and left lower resulted with extreme tightness. Consulted with charge nurse Asher Muir and proceeded with Dig Stim. Was able to get out small amount of stool but no relief was given. Gave scheduled suppository early to help with bowl movement to release tightness and pain. No further concerns at this time. Call bell in reach, gf at bedside.
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
## 2023-03-30 MED ORDER — ONDANSETRON HCL 4 MG PO TABS
4.0000 mg | ORAL_TABLET | Freq: Four times a day (QID) | ORAL | Status: DC | PRN
  Administered 2023-03-30 – 2023-04-07 (×2): 4 mg via ORAL
  Filled 2023-03-30 (×2): qty 1
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
## 2023-03-30 NOTE — Progress Notes (Signed)
IP Rehab Bowel Program Documentation   Bowel Program Start time [phone]  Dig Stim Indicated? Yes  Dig Stim Prior to Suppository or mini Enema X 2   Output from dig stim: Large  Ordered intervention: Suppository Yes , mini enema No ,   Repeat dig stim after Suppository or Mini enema  X 2,  Output? Large   Bowel Program Complete? Yes , handoff given next  nurse  Patient Tolerated? Yes

## 2023-03-30 NOTE — Progress Notes (Signed)
Patient ID: Lee [name], male   DOB: 04-23-2006, 17 y.o.   MRN: [id number]  SW faxed FMLA forms to [phone].  *Pt mother not here today. SW will follow-up. Long discussion today with pt about progress made, and how to manage emotions with regard to current circumstances and remaining positive.   Cecile Sheerer, MSW, LCSWA Office: [phone] Cell: [phone] Fax: [phone]
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
## 2023-03-30 NOTE — Progress Notes (Signed)
Occupational Therapy Session Note  Patient Details  Name: Lee [name] MRN: [id number] Date of Birth: 01-Dec-2005  Today's Date: 03/30/2023 OT Individual Time: 4098-1191 OT Individual Time Calculation (min): 55 min    Short Term Goals: Week 1:  OT Short Term Goal 1 (Week 1): Pt will perform long sit mat level with B UE support in prep for LB dressing with CGA OT Short Term Goal 1 - Progress (Week 1): Met OT Short Term Goal 2 (Week 1): Pt will transfer to most appropriate shower seating DME with TB and mod A OT Short Term Goal 2 - Progress (Week 1): Met OT Short Term Goal 3 (Week 1): Pt will sit EOB with CGA for BADL's with min A OT Short Term Goal 3 - Progress (Week 1): Met Week 2:  OT Short Term Goal 1 (Week 2): Pt will complete LB dressing at bed level with Min A with AE as necessary OT Short Term Goal 1 - Progress (Week 2): Progressing toward goal OT Short Term Goal 2 (Week 2): Pt will complete lateral transfers with Min A including transfer board placement and leg management OT Short Term Goal 2 - Progress (Week 2): Progressing toward goal OT Short Term Goal 3 (Week 2): Pt will complete bathing with Min A with AE as necessary OT Short Term Goal 3 - Progress (Week 2): Progressing toward goal Week 3:  OT Short Term Goal 1 (Week 3): Pt will self initiate all aspects of UB self care and complete with close S only OT Short Term Goal 2 (Week 3): Family/caregiver will demonstrate safety and competency for shower chair access with 1 staff member SBA and pt self directing Week 4:     Skilled Therapeutic Interventions/Progress Updates:    1:1 Pt received in the bed asleep with girlfriend and mom in recliner. Pt requesting to shower in session but discussed how that is not our goal in OT session now that he is checked off to perform with girlfriend independently. Pt's mom present in session as well. Discussed the plan to go to [place] (where his mom lives) after d/c. Discussed use of padded tub  bench for toileting and showering. Discussed that currently his mom's place has a tub shower but doorway is not w/c accessible. Pt reports he is sweating from the night and would like to wash periarea and change brief.  Pt was given wash cloths to wash self but declined and asked girlfriend to perform. She washed and donned clean breif with pt rolling with bed rails with supervision. TEDS and leg loops donned and pt able to thread pants and socks with setup and pull pants up. Pt able to manage LEs and get to EOB with supervision. Pt Performed slide board transfer with contact guard but required cues to place board properly and right side up.   Pt, pt's mom, and girlfriend went down to tub room and performed  squat pivot transfer w/c ot tub bench over the ledge with min A for balance and help with placement of LEs to make sure they dont get caught coming back to chair. Discussed transferring with clothing v naked and squat pivot v slide board.  Pt left with family and they went downstairs to get food.    Therapy Documentation Precautions:  Precautions Precautions: Fall, Other (comment), Back Precaution Comments: paraplegia, chest tube Required Braces or Orthoses: Spinal Brace Spinal Brace: Thoracolumbosacral orthotic, Applied in supine position Restrictions Weight Bearing Restrictions: No  Pain: No c/o pain in  session    Therapy/Group: Individual Therapy  Lee [name] [facility] 03/30/2023, 11:23 AM

## 2023-03-30 NOTE — Progress Notes (Addendum)
Physical Therapy Session Note  Patient Details  Name: Lee [name] MRN: [id number] Date of Birth: 2006-06-04  Today's Date: 03/30/2023 PT Individual Time: 0950-1100, 4098-11914 PT Individual Time Calculation (min): 70 min, 70 min   Short Term Goals: Week 2:  PT Short Term Goal 1 (Week 2): Pt will tolerate sitting up in w/c >4 hours PT Short Term Goal 1 - Progress (Week 2): Met PT Short Term Goal 2 (Week 2): Pt will self propel w/c in community environment PT Short Term Goal 2 - Progress (Week 2): Met PT Short Term Goal 3 (Week 2): Pt will perform bed mobility with supervision and leg loops consistently PT Short Term Goal 3 - Progress (Week 2): Met  Skilled Therapeutic Interventions/Progress Updates:    Session 1: Pt recd in w/c hunched over trash can reporting that he just vomited. Consulted with MD, who discussed with pt and recommended crackers with meds. While waiting for nausea medication, lengthy discussion about pt's participation in his rehab, as well as goals and what he wants to do in the future (ie return to work/school). Discussed that pt needs to participate in rehab as if it is his job for improvements in function. Pt expressed understanding.  Pt then seemed to be feeling better and agreeable to participate. Pt propelled w/c with BUE with supervision. Did discuss wearing shoes for foot protection. Squat pivot <>mat table with supervision, improving ability to manage LE. Pt transitioned from short sitting<>long sitting<>circle sitting with cueing only.  Performed reaching and mat mobility tasks for trunk control to increase independence in transfers. Returned to room after session and remained in room with plan to shower, discussed pt being up and ready for next session, pt agreed.   Session 2: Pt recd in bed, sleeping and taking several minutes to rouse. Discussed that therapist had asked pt to be dressed and ready on return. Pt states he is tired from being up since 7 and "high"  on pain medications. Discussed that if this is the case should consult MD for to ensure medication is not causing impairment. Pt then dressed, including ted hose, leg loops, and brace, with set up a. Bed mobility and transfer to w/c with supervision.   W/c propulsion with supervision >300 ft to retrieve tools to adjust brakes. Pt then transferred to mat table with supervision and use of leg loops. While therapist adjusting brakes, pt engaged in conversation with CSW, in which he became emotional and tearful about his situation and his frustration that he has not had LE return. Provided psychosocial support and active listening. Pt was able to express that he can see where he has improved, he just believed he would have more return by now. Pt expressed gratitude and appreciation, as well as desire to keep working.   Pt then participated in stretching in supine on mat table. He was able to perform hip rotation stretch in hooklying, knee to chest, and hamstring stretch without assistance after instruction while therapist finished adjusting w/c brakes.  Pt did require assist to transition in/out of hooklying piriformis/figure 4 stretch.  Provided HEP hand out of stretches that pt can perform independently in bed. Pt returned to room and remained in w/c.   Therapy Documentation Precautions:  Precautions Precautions: Fall, Other (comment), Back Precaution Comments: paraplegia, chest tube Required Braces or Orthoses: Spinal Brace Spinal Brace: Thoracolumbosacral orthotic, Applied in supine position Restrictions Weight Bearing Restrictions: No General:       Therapy/Group: Individual Therapy  [name] 03/30/2023,  10:06 AM

## 2023-03-31 MED ORDER — LIDOCAINE HCL URETHRAL/MUCOSAL 2 % EX GEL
1.0000 | CUTANEOUS | Status: DC | PRN
  Administered 2023-03-31 – 2023-04-07 (×2): 1 via URETHRAL
  Filled 2023-03-31 (×2): qty 6

## 2023-03-31 NOTE — Progress Notes (Signed)
Pt attempted to self cath with extensive encouragement. Patient was hesitant at first, but eventually willing to try. Used home cath kit and guided patient with insertion using clean technique. Patient advanced halfway through catheter and met resistance. Patient became anxious and diaphoretic at that point in the procedure. Provided emotional support, relaxation techniques, and cold cloths for comfort. Pt resumed trying to advance catheter, but unable to reach bladder. This Clinical research associate took over and attempted to cath using a different coude cath and noted bright red blood. Patient became extremely anxious at the sight of blood and started screaming to remove it. This Clinical research associate aborted procedure. Dr. Berline Chough made aware of this. New order received to insert indwelling coude catheter. Pain medication administered for comfort prior to attempt to place catheter.   Marylu Lund, RN

## 2023-03-31 NOTE — Progress Notes (Signed)
Physical Therapy Session Note  Patient Details  Name: Lee [name] MRN: [id number] Date of Birth: Jan 21, 2006  Today's Date: 03/31/2023 PT Individual Time:  -      Short Term Goals: Week 3:  PT Short Term Goal 1 (Week 3): Pt will perform bed/chair transfers with supervision PT Short Term Goal 2 (Week 3): Pt will participate in high level w/c skills training (wheelies, etc.) PT Short Term Goal 3 (Week 3): Pt will perform sit>supine with CGA or better  Skilled Therapeutic Interventions/Progress Updates: Upon PTA arrival pt appeared to be sleeping. PTA providing noxious stimuli including knocking on bed, speaking loudly, and nudging pt on arm. Pt would flutter eyes but would not verbally respond to this therapist. Pt missed 60 min skilled PT due to pt lethargy/refusal.   Attempted for PM session, discussion with nsg, orders in place for pt to be cathed with this time being used for opportunity for education. Will continue efforts as schedule permits.      Therapy Documentation Precautions:  Precautions Precautions: Fall, Other (comment), Back Precaution Comments: paraplegia, chest tube Required Braces or Orthoses: Spinal Brace Spinal Brace: Thoracolumbosacral orthotic, Applied in supine position Restrictions Weight Bearing Restrictions: No General: PT Amount of Missed Time (min): 60 Minutes and 75 min  PT Missed Treatment Reason: Patient unwilling to participate and nsg education  Therapy/Group: Individual Therapy  Mattox Schorr 03/31/2023, 12:25 PM
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
## 2023-03-31 NOTE — Progress Notes (Signed)
Occupational Therapy Session Note  Patient Details  Name: Lee [name] MRN: [id number] Date of Birth: [birth year]  Today's Date: 03/31/2023 OT Individual Time:  -  Pt missed 60 min of therapy due to feeling nauseated and feeling tired. Offered breakfast, crackers and ginger ale, RN came and offer meds- pt declined all. Pt reports not feeling like participating due being tired and not feeling well. Multiple attempts to try to engage pt. Will try to make up as time allows.       Short Term Goals: Week 1:  OT Short Term Goal 1 (Week 1): Pt will perform long sit mat level with B UE support in prep for LB dressing with CGA OT Short Term Goal 1 - Progress (Week 1): Met OT Short Term Goal 2 (Week 1): Pt will transfer to most appropriate shower seating DME with TB and mod A OT Short Term Goal 2 - Progress (Week 1): Met OT Short Term Goal 3 (Week 1): Pt will sit EOB with CGA for BADL's with min A OT Short Term Goal 3 - Progress (Week 1): Met Week 2:  OT Short Term Goal 1 (Week 2): Pt will complete LB dressing at bed level with Min A with AE as necessary OT Short Term Goal 1 - Progress (Week 2): Progressing toward goal OT Short Term Goal 2 (Week 2): Pt will complete lateral transfers with Min A including transfer board placement and leg management OT Short Term Goal 2 - Progress (Week 2): Progressing toward goal OT Short Term Goal 3 (Week 2): Pt will complete bathing with Min A with AE as necessary OT Short Term Goal 3 - Progress (Week 2): Progressing toward goal Week 3:  OT Short Term Goal 1 (Week 3): Pt will self initiate all aspects of UB self care and complete with close S only OT Short Term Goal 2 (Week 3): Family/caregiver will demonstrate safety and competency for shower chair access with 1 staff member SBA and pt self directing  Skilled Therapeutic Interventions/Progress Updates:    Pt   Therapy Documentation Precautions:  Precautions Precautions: Fall, Other (comment), Back Precaution  Comments: paraplegia, chest tube Required Braces or Orthoses: Spinal Brace Spinal Brace: Thoracolumbosacral orthotic, Applied in supine position Restrictions Weight Bearing Restrictions: No General: General OT Amount of Missed Time: 60 Minutes Vital Signs:   Pain: Pain Assessment Pain Scale: 0-10 Pain Score: 0-No pain  Therapy/Group: Individual Therapy  Roney Mans [facility] 03/31/2023, 10:10 AM

## 2023-03-31 NOTE — Progress Notes (Signed)
Patient ID: Lee [name], male   DOB: July 21, 2006, 17 y.o.   MRN: [id number]  SW spoke with pt mother to discuss d/c plan. Mother expresses concerns about pt not having insurance, and if pt will d/c to [place]. She will be here this afternoon for SW and her to discuss further.   SW able to investigate pt now has insurance- [facility] Medicaid.  *SW went by room in the afternoon and has not arrived yet.   Cecile Sheerer, MSW, LCSWA Office: [phone] Cell: [phone] Fax: [phone]

## 2023-04-01 MED ORDER — CHLORHEXIDINE GLUCONATE CLOTH 2 % EX PADS
6.0000 | MEDICATED_PAD | Freq: Two times a day (BID) | CUTANEOUS | Status: DC
  Administered 2023-04-01 – 2023-04-03 (×4): 6 via TOPICAL

## 2023-04-01 NOTE — Progress Notes (Signed)
Occupational Therapy Session Note  Patient Details  Name: Lee [name] MRN: [id number] Date of Birth: 07/03/2006  Today's Date: 04/01/2023 OT Individual Time: 7829-5621 OT Individual Time Calculation (min): 55 min    Short Term Goals: Week 1:  OT Short Term Goal 1 (Week 1): Pt will perform long sit mat level with B UE support in prep for LB dressing with CGA OT Short Term Goal 1 - Progress (Week 1): Met OT Short Term Goal 2 (Week 1): Pt will transfer to most appropriate shower seating DME with TB and mod A OT Short Term Goal 2 - Progress (Week 1): Met OT Short Term Goal 3 (Week 1): Pt will sit EOB with CGA for BADL's with min A OT Short Term Goal 3 - Progress (Week 1): Met Week 2:  OT Short Term Goal 1 (Week 2): Pt will complete LB dressing at bed level with Min A with AE as necessary OT Short Term Goal 1 - Progress (Week 2): Progressing toward goal OT Short Term Goal 2 (Week 2): Pt will complete lateral transfers with Min A including transfer board placement and leg management OT Short Term Goal 2 - Progress (Week 2): Progressing toward goal OT Short Term Goal 3 (Week 2): Pt will complete bathing with Min A with AE as necessary OT Short Term Goal 3 - Progress (Week 2): Progressing toward goal  Skilled Therapeutic Interventions/Progress Updates:    1:1 Pt in bed with girlfriend asleep when arrived with RN. Pt required extra time awake up. Pt agreeable to therapy and getting up but did initially decline meds. Pt reported being upset; discussed recommendations for no THC while taking meds and denied taking any and understood the recommendation. Discussed that p reports he does not want to get a hospital bed. Discussed/ problem solving how to dress at bed level without features or rails of bed. PT was able to pull self up to head board and come into long sitting. PT able to thread pants and cathter and don LB clothing. Pt donned shirt and TLSO in supine with lateral rolls. Due to pain in back  and brace pt not able to fasten leg loops at the ankle portion - requiring some assistance. Pt able to come into sitting EOB with contact guard - pt not using the most effective way to come into sitting for back precautions. Pt able to transfer "popping" over into the w/c without bumping on the wheel without using slide board.  In the dayroom transfer on to the mat with supervision with cues for placement; discussed and demonstrated how to lay down and get back up protecting his back with rolling onto his side and then pushing up into sitting (without having a hand behind him twisting.)Pt reported this method felt better.  Pt reported he was going to his aunt's house - she recently moved and he is able to enter the front of the house (no steps) at w/c level.   Recommended padded tub bench for home use for toileting.   Therapy Documentation Precautions:  Precautions Precautions: Fall, Other (comment), Back Precaution Comments: paraplegia, chest tube Required Braces or Orthoses: Spinal Brace Spinal Brace: Thoracolumbosacral orthotic, Applied in supine position Restrictions Weight Bearing Restrictions: No  Pain:  Reported back pain - requested meds in session and RN came to administer   Therapy/Group: Individual Therapy  Roney Mans [facility] 04/01/2023, 4:02 PM

## 2023-04-01 NOTE — Progress Notes (Signed)
Recreational Therapy Session Note  Patient Details  Name: Lee [name] MRN: [id number] Date of Birth: 2006-06-02 Today's Date: 04/01/2023  Pain: c/o intermittent back and LE pain, relieved with stretching or repositioning Skilled Therapeutic Interventions/Progress Updates: Goal:  Pt will maintain dynamic sitting balance during circle sit for mod complex tasks with contact guard assist.  Pt performed w/c mobility throughout the unit with Mod I.  Pt performed scoot pivot transfers w/c<-->mat with supervision using leg loops for LE mobility.  Once seated on mat, pt assisted into circle sit.  Pt performed modified basketball passes with contact guard assist-close supervision reaching outside BOS to catch passes.  Pt able to shoot basketball in this position as well.  Transitioned to sitting on physioball with frame with total assist +2 for safety.  Once seated on physioball, pt provided with assistance by PT while passing and shooting basketball.  Pt directing his care with set up and transfers with Mod I.  Pt smiling and easily engaged in therapy session today.  Therapy/Group: Co-Treatment  Rohan Juenger 04/01/2023, 3:47 PM
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
## 2023-04-01 NOTE — Progress Notes (Signed)
IP Rehab Bowel Program Documentation   Bowel Program Start time 2216 - Started later d/t pt wanting to go outside on day shift, then family had brought food in so told pt we would do bowel program after he finished eating.  Dig Stim Indicated? Yes  Dig Stim Prior to Suppository or mini Enema X 1   Output from dig stim: Minimal  Ordered intervention: Suppository Yes , mini enema No ,   Repeat dig stim after Suppository or Mini enema  X 3,  Output? Small   Bowel Program Complete? Yes ,   Patient Tolerated? Yes

## 2023-04-01 NOTE — Progress Notes (Signed)
Patient ID: Lee [name], male   DOB: 2006/06/25, 17 y.o.   MRN: [id number]  SW spoke with pt mother [name] to discuss discharge plan. Plan is for pt to remain here in [place] until appropriate to move due to current medical issues. [facility]. She is aware SW will confirm outpatient or HH therapies. States if outpatient, he will have transportation to appointments. SW will order [facility] and slide board. She addressed medical concerns (bowel program consistency) and the reason he bleeds when I/O, and requests that attending follow-up. SW informed will follow-up with final recommendations.   Cecile Sheerer, MSW, LCSWA Office: [phone] Cell: [phone] Fax: [phone]

## 2023-04-01 NOTE — Progress Notes (Signed)
Physical Therapy Session Note  Patient Details  Name: Cayetano Mikita MRN: [id number] Date of Birth: April 14, 2006  Today's Date: 04/01/2023 PT Individual Time: 1050-1200 and 1423-1535 PT Individual Time Calculation (min): 70 min and 72 min  Short Term Goals: Week 3:  PT Short Term Goal 1 (Week 3): Pt will perform bed/chair transfers with supervision PT Short Term Goal 2 (Week 3): Pt will participate in high level w/c skills training (wheelies, etc.) PT Short Term Goal 3 (Week 3): Pt will perform sit>supine with CGA or better  Skilled Therapeutic Interventions/Progress Updates: Pt presented in w/c agreeable to therapy. Pt denies pain initially during session but c/o intermittent sensory issues in B thighs at end of session. Session focused on higher level w/c management with pt propelling at mod I level throughout hospital as well as outside in parking lot, and near employee entrance. Pt was able to safely manage doors, hit elevator buttons, and manage slopes and crowded areas with good safety and continued mod I. At physician parking deck practiced curb nudges with PTA elevating anti tippers to allow for increased heigh. Pt required increased effort initially but was able to complete easier with repetition. Upon return to unit pt attempted to bump up 5in platform after curb nudge however was unable to power reach wheel enough to complete transition despite good efforts. Pt then asking questions regarding style of w/c (can it collapse etc). At high/low mat pt was able to set up w/c in preparation for transfer with supervision and complete lateral scoot transfer with supervision. Pt was able to provide enough height to clear hips with visible space. Pt was able to unlock breaks and move w/c (performing forward lean to unlock both breaks) without assist. PTA then demonstrated to pt how this particular chair has removable wheels and back can fold forward but explained that many ultra light chairs have rigid  frames. Pt was then able to complete transfer back to w/c in same manner as prior. Pt propelled back to room and requested to return to bed. With set up assist pt able to complete transfer back to bed with supervision and was also able to transfer sit to supine with use of leg loops with supervision. Pt left in bed at end of session with call bell within reach and needs met.   Tx2: Pt presented in bed agreeable to therapy. LIsa, RT present throughout session.  Pt denies pain intially but increased pain in back at end of session with nursing notified. Pt requesting new clothes as previous ones had been removed. Once new clothing obtained pt was able to don top in supine (HOB slightly elevated) and PTA provided assist in threading pants and pulling to knees with pt able to complete pulling over hips with small rolls. Pt's TLSO had already been in place and was completed by GF. Bed rails were lowered and pt was able to complete supine to sit to EOB with supervision and increased effort. Pt then completed transfer to w/c with supervision only. Pt propelled to day room mod I and performed lateral scoot transfer with supervision. Pt was able to scoot completely onto high/low mat and worked on long sit to circle sitting without leg loops to sitting balance as well as practice to don leg loops in bed. Pt was able to maintain circle sitting and tap forward backward in circle and out of circle with close supervision and only x 1 LOB when moved quickly. Activity then progressed to ball toss while remaining in circle  sitting then to throwing small basketball continuing to remain in circle sitting. Pt then agreeable to try sitting on physioball (with ball placed in mount). Therapist assisted in moving legs anteirorly while pt scooted  towards EOM. A +2 anterior scoot was then completed onto ball. Pt then worked on achieving and maintaining balance while sitting on ball. Pt required assist from PTA and RT for BLE management as  well as guarding posteriorly from PTA. Pt worked on reaching tasks progressing to ball tosses all requiring modA to maintain balance. Pt returned to EOM boosting up and posteriorly back onto mat and then completed transfer to w/c with supervision. Pt propelled back to room mod I and requested to remain in w/c as GF will assist pt with taking shower. Pt left in w/c with current needs met and pt in NAD.      Therapy Documentation Precautions:  Precautions Precautions: Fall, Other (comment), Back Precaution Comments: paraplegia, chest tube Required Braces or Orthoses: Spinal Brace Spinal Brace: Thoracolumbosacral orthotic, Applied in supine position Restrictions Weight Bearing Restrictions: No General:   Vital Signs: Therapy Vitals Temp: 98.3 F (36.8 C) Temp Source: Oral Pulse Rate: (!) 108 Resp: 16 BP: 125/79 Patient Position (if appropriate): Lying Oxygen Therapy SpO2: 100 % O2 Device: Room Air Pain:   Mobility:   Locomotion :    Trunk/Postural Assessment :    Balance:   Exercises:   Other Treatments:      Therapy/Group: Individual Therapy  Mavis Fichera 04/01/2023, 4:25 PM

## 2023-04-01 NOTE — Progress Notes (Signed)
Attempted again to perform bowel program. Pt states that he is waiting for his aunt to bring him food and then he will be read. for bowel program.

## 2023-04-02 NOTE — Progress Notes (Signed)
Occupational Therapy Session Note  Patient Details  Name: Trysten Presutti MRN: [id number] Date of Birth: 07-02-2006  Today's Date: 04/02/2023 OT Missed Time: 30 Minutes Missed Time Reason: Patient unwilling/refused to participate without medical reason   Short Term Goals: Week 3:  OT Short Term Goal 1 (Week 3): Pt will self initiate all aspects of UB self care and complete with close S only OT Short Term Goal 2 (Week 3): Family/caregiver will demonstrate safety and competency for shower chair access with 1 staff member SBA and pt self directing  Skilled Therapeutic Interventions/Progress Updates:  Pt refuses session, stating "I didn't even get to sleep." Pt misses ~30 mins of skilled OT intervention, to be made up as appropriate.   Therapy Documentation Precautions:  Precautions Precautions: Fall, Other (comment), Back Precaution Comments: paraplegia, chest tube Required Braces or Orthoses: Spinal Brace Spinal Brace: Thoracolumbosacral orthotic, Applied in supine position Restrictions Weight Bearing Restrictions: No   Therapy/Group: Individual Therapy  Lou Cal, OTR/L, MSOT  04/02/2023, 5:38 AM
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
## 2023-04-02 NOTE — Progress Notes (Signed)
IP Rehab Bowel Program Documentation   Bowel Program Start time 2253  Dig Stim Indicated? Yes  Dig Stim Prior to Suppository or mini Enema X 1   Output from dig stim: None  Ordered intervention: Suppository Yes , mini enema No ,   Repeat dig stim after Suppository or Mini enema  X 2,  Output? Small   Bowel Program Complete? Yes ,   Patient Tolerated? Yes

## 2023-04-02 NOTE — Progress Notes (Signed)
Talked to aunt, let her know about the educational binder, SO other as well, and verified information for bowel/bladder care including foley care in binder. Also provided extra leg strap for foley and cylinder for urine collection. Placed bag of supplies with binder in wall pocket.

## 2023-04-02 NOTE — Progress Notes (Signed)
Physical Therapy Session Note  Patient Details  Name: Lee [name] MRN: [id number] Date of Birth: August 22, 2006  Today's Date: 04/02/2023 PT Individual Time: 1050-1203 PT Individual Time Calculation (min): 73 min   Short Term Goals: Week 3:  PT Short Term Goal 1 (Week 3): Pt will perform bed/chair transfers with supervision PT Short Term Goal 2 (Week 3): Pt will participate in high level w/c skills training (wheelies, etc.) PT Short Term Goal 3 (Week 3): Pt will perform sit>supine with CGA or better  Skilled Therapeutic Interventions/Progress Updates:    pt received in bed, sleeping and slow to rouse, taking >5 minutes. Donned shirt, socks, and brace with set up A. Session focused on w/c propulsion and transfer  training for endurance. Cues for more efficient push stroke and ensuring BLE remained on foot plate.   Transfers <>mat table from both sides and standard and light weight w/c with supervision and cueing for safety.   Pt participated in testing for justification of K0005 manual wheelchair. Pt propelled both chairs ~50 ft with therapist timing middle 30 ft.  K0004 chair: 9.06 seconds, 12 push strokes, BORG= 15/20 K0005 chair: 8.30 seconds, 6 push strokes, BORG- 7/20   Pt noted to have small incontinent bowel movement. Returned to room and to bed with supervision. Encouraged pt to participate in his care, but pt vehemently refused stating he will always have assistance, so dependent brief change instead.   Pt remained in bed with his aunt and girlfriend present. Agreeable to getting up before next session.   Therapy Documentation Precautions:  Precautions Precautions: Fall, Other (comment), Back Precaution Comments: paraplegia, chest tube Required Braces or Orthoses: Spinal Brace Spinal Brace: Thoracolumbosacral orthotic, Applied in supine position Restrictions Weight Bearing Restrictions: No General:       Therapy/Group: Individual Therapy  [name] 04/02/2023, 12:10 PM

## 2023-04-02 NOTE — Progress Notes (Signed)
Physical Therapy Session Note  Patient Details  Name: Lee [name] MRN: [id number] Date of Birth: 2006-07-16  Today's Date: 04/02/2023 PT Individual Time: 8295-6213 PT Individual Time Calculation (min): 66 min   Short Term Goals: Week 3:  PT Short Term Goal 1 (Week 3): Pt will perform bed/chair transfers with supervision PT Short Term Goal 2 (Week 3): Pt will participate in high level w/c skills training (wheelies, etc.) PT Short Term Goal 3 (Week 3): Pt will perform sit>supine with CGA or better  Skilled Therapeutic Interventions/Progress Updates:    Pt recd seated up in w/c and ready for session. Pt's aunt present for family education. Pt performed several transfers bed<>chair and mat<>chair with supervision overall. Cueing to ensure good clearance of wheel and BLE/catheter management. Pt's aunt instructed in stretching routine to maintain ROM and manage spasticity. Pt also demoes supine<>prone rolling to stretch hip flexors. During session, loaner w/c arrived, so tested to ensure good fit. Provided education on how best to assist in recovery at home, including encouraging independence. Also discussed pressure relief for car ride home. Pt returned to bed at end of session and was left with family present.   Therapy Documentation Precautions:  Precautions Precautions: Fall, Other (comment), Back Precaution Comments: paraplegia, chest tube Required Braces or Orthoses: Spinal Brace Spinal Brace: Thoracolumbosacral orthotic, Applied in supine position Restrictions Weight Bearing Restrictions: No General:       Therapy/Group: Individual Therapy  [name] 04/02/2023, 3:45 PM

## 2023-04-02 NOTE — Progress Notes (Signed)
Occupational Therapy Weekly Progress Note  Patient Details  Name: Lee [name] MRN: [id number] Date of Birth: 05-10-2006  Beginning of progress report period: March 26, 2023 End of progress report period: April 02, 2023  Today's Date: 04/02/2023 OT Individual Time: 0272-5366 OT Individual Time Calculation (min): 40 min    Patient has met 2 of 2 short term goals plus 4/4 STG's from previous week. Pt has made significant progress with self care especially dressing this week. Pt will d/c with a Foley cath to f/u with urology but bowel program progressing. Padded TTB training ongoing. Solidified d/c plan and pt's DME needs. OT continues to encourage establishing a more effective therapy routine to self initiate to address gym and community skills level activity for last days ere at CIR. Pain  Patient continues to demonstrate the following deficits: muscle weakness, muscle joint tightness, and muscle paralysis, decreased cardiorespiratoy endurance, abnormal tone and unbalanced muscle activation, and decreased sitting balance, decreased standing balance, decreased postural control, decreased balance strategies, and difficulty maintaining precautions and therefore will continue to benefit from skilled OT intervention to enhance overall performance with BADL, iADL, School/Education, and Reduce care partner burden.  Patient progressing toward long term goals..  Continue plan of care.  OT Short Term Goals Week 3:  OT Short Term Goal 1 (Week 3): Pt will self initiate all aspects of UB self care and complete with close S only OT Short Term Goal 1 - Progress (Week 3): Met OT Short Term Goal 2 (Week 3): Family/caregiver will demonstrate safety and competency for shower chair access with 1 staff member SBA and pt self directing OT Short Term Goal 2 - Progress (Week 3): Met Week 4:  OT Short Term Goal 1 (Week 4): STGs=LTGs d/t LOS  Skilled Therapeutic Interventions/Progress Updates:   Pt seen for am skilled  OT session. Pt and GF in bed asleep upon OT arrival. OT encouraged up and participation in full am self care and gym access. Pt requests bed level activity only this am due to report of not sleeping well last night. Reassessed full dressing routine as now pt is able to shower with support of GF on rolling shower chair with CGA for transfers and use of LH sponge and lateral weight shifts for LB bathing. Pt reports he is able to dressing himself. OT challenged and placed all items bed level with bed flat to mirror home as pt refusing hospital bed. Pt able to manage thigh high TEDs with increased time and effort with figure 4 supine technique, donned scrub bottoms with Foley cath mngt with CGA. Donned top portion of leg loops with set up and min A fading to CGA for lower portion. Pt remained bed level as per request.   Pain: Denies pain this session but reports feeling tired from not sleeping last night   Therapy Documentation Precautions:  Precautions Precautions: Fall, Other (comment), Back Precaution Comments: paraplegia, chest tube Required Braces or Orthoses: Spinal Brace Spinal Brace: Thoracolumbosacral orthotic, Applied in supine position Restrictions Weight Bearing Restrictions: No    Therapy/Group: Individual Therapy  [name] 04/02/2023, 7:49 AM

## 2023-04-02 NOTE — Progress Notes (Signed)
IP Rehab Bowel Program Documentation   Bowel Program Start time [phone]  Dig Stim Indicated? Yes  Dig Stim Prior to Suppository-YES  Output from dig stim: Minimal  Ordered intervention: Suppository Yes    Repeat dig stim after SuppositoryYes  Bowel Program Complete? No , handoff given to next shift

## 2023-04-03 DIAGNOSIS — G4700 Insomnia, unspecified: Secondary | ICD-10-CM
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
## 2023-04-04 NOTE — Progress Notes (Signed)
Occupational Therapy Session Note  Patient Details  Name: Lee [name] MRN: [id number] Date of Birth: 08-May-2006  Today's Date: 04/04/2023 OT Individual Time: 4098-1191 OT Individual Time Calculation (min): 39 min  and Today's Date: 04/04/2023 OT Missed Time: 20 Minutes Missed Time Reason:  (Missed minutes due to delay in care.)   Short Term Goals: Week 4:  OT Short Term Goal 1 (Week 4): STGs=LTGs d/t LOS  Skilled Therapeutic Interventions/Progress Updates:  Pt received sleeping in bed with companion by his side. Pt difficult to arouse, requiring increased time and cuing to awaken. Pt with strong pain in BLEs, describing as "cramping." RN aware of pain, available to administer medications, but patient persistently declining. OT offering intermediate rest breaks and positioning suggestions throughout session to address pain/fatigue and maximize participation/safety in session. OT session focused on functional transfers, BADL participation, and direction of care.   Pt comes to EOB with max A provided for BLE management. Pt refuses to use padded TTB despite of education/purpose for training, stating "that shower floor is dirty. . . I just don't want to." Walk-in shower transfer completed with use of rolling shower chair, pt performs slide-board transfer with CGA + assistance for BLE management over AE leg.   Pt performs x2 WC pushups to assist with doffing of shorts/brief, smearing present in brief, pt declines the opportunity to have BM while seated on shower chair, despite of education on the benefits of upright posture gravity. Pt request significant other assist with bathing, as she is checked off for both bathing/transfers. OT updates nursing staff.    Pt remained in shower with all immediate needs met at end of session. Pt continues to be appropriate for skilled OT intervention to promote further functional independence.   Therapy Documentation Precautions:  Precautions Precautions:  Fall, Other (comment), Back Precaution Comments: paraplegia, chest tube Required Braces or Orthoses: Spinal Brace Spinal Brace: Thoracolumbosacral orthotic, Applied in supine position Restrictions Weight Bearing Restrictions: No   Therapy/Group: Individual Therapy  Lou Cal, OTR/L, MSOT  04/04/2023, 6:34 AM
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
## 2023-04-04 NOTE — Progress Notes (Signed)
Physical Therapy Session Note  Patient Details  Name: Lee [name] MRN: [id number] Date of Birth: 09/15/2006  Today's Date: 04/04/2023 PT Individual Time: 1420-1530 PT Individual Time Calculation (min): 70 min   Short Term Goals: Week 3:  PT Short Term Goal 1 (Week 3): Pt will perform bed/chair transfers with supervision PT Short Term Goal 2 (Week 3): Pt will participate in high level w/c skills training (wheelies, etc.) PT Short Term Goal 3 (Week 3): Pt will perform sit>supine with CGA or better  Skilled Therapeutic Interventions/Progress Updates: Pt presented in bed with GF present sleeping taking increased time to rouse. Once aware pt agreeable to therapy. Pt requesting e-stim with PTA deferring as advised pt when last used only had one small ms twitch in duration of 20 min and would not be beneficial at this time. Pt verbalized understanding. PTA threaded pants and foley bag total A with pt rolling with use of bed features to pull pants over hips. Pt raised HOB and donned shirt with set up and donned TLSO with set up assist. PTA set up w/c in preparation for transfer with pt noting that difficult to place arm rest. PTA added lubrication to arm rest and was able to place back in w/c. Discussed as loaner chair just swing armrest out and not to remove at this time. Pt performed supine to sit EOB with supervision and use of bed features. Performed squat pivot transfer with PTA managing foley and CGA as pt was unable to fully clear second leg due to higher cushion. Once pt adjusted in bed propelled to day room and had pt work on w/c set up and block practice squat pivot transfers between w/c and mat. Pt progressed from CGA to close supervision from lower levels. Discussed height of various surfaces that pt would transfer upon d/c (car, bed, couch) with pt indicating highest surface would be bed which is near level with current w/c height. Pt completed final transfer to w/c with supervision and propelled  back to room modI. Pt completed transfer to flat bed with supervision and was able to manage BLE independently with use of leg loops to transfer to supine. Pt doffed TLSO independently and left in bed at end of session with call bell within reach and needs met.      Therapy Documentation Precautions:  Precautions Precautions: Fall, Other (comment), Back Precaution Comments: paraplegia, chest tube Required Braces or Orthoses: Spinal Brace Spinal Brace: Thoracolumbosacral orthotic, Applied in supine position Restrictions Weight Bearing Restrictions: No   Therapy/Group: Individual Therapy  Geralynn Capri 04/04/2023, 4:31 PM

## 2023-04-04 NOTE — Progress Notes (Signed)
Upon entering room pt states that he does not want any pills. He is agreeable to Lovenox administration only.

## 2023-04-04 NOTE — Progress Notes (Signed)
Pt refused bowel program at this time. Pts family member is in room and says that she is going to go get food for pt now and he is agreeable to bowel program after his dinner. No other complaints voiced at this time.

## 2023-04-05 LAB — BASIC METABOLIC PANEL
Anion gap: 9 (ref 5–15)
BUN: 12 mg/dL (ref 6–20)
CO2: 26 mmol/L (ref 22–32)
Calcium: 9.2 mg/dL (ref 8.9–10.3)
Chloride: 104 mmol/L (ref 98–111)
Creatinine, Ser: 0.77 mg/dL (ref 0.61–1.24)
GFR, Estimated: >60 mL/min (ref >60)
Glucose, Bld: 98 mg/dL (ref 70–99)
Potassium: 3.7 mmol/L (ref 3.5–5.1)
Sodium: 139 mmol/L (ref 135–145)

## 2023-04-05 LAB — CBC
HCT: 33.8 % — ABNORMAL LOW (ref 39.0–52.0)
Hemoglobin: 10.5 g/dL — ABNORMAL LOW (ref 13.0–17.0)
MCH: 27.4 pg (ref 26.0–34.0)
MCHC: 31.1 g/dL (ref 30.0–36.0)
MCV: 88.3 fL (ref 80.0–100.0)
Platelets: 251 10*3/uL (ref 150–400)
RBC: 3.83 MIL/uL — ABNORMAL LOW (ref 4.22–5.81)
RDW: 13.1 % (ref 11.5–15.5)
WBC: 6.9 10*3/uL (ref 4.0–10.5)
nRBC: 0 % (ref 0.0–0.2)

## 2023-04-05 NOTE — Progress Notes (Signed)
IP Rehab Bowel Program Documentation   Bowel Program Start time 2200  Dig Stim Indicated? Yes   Output from dig stim: Minimal  Ordered intervention: Suppository Yes , mini enema No ,   Output? Minimal   Bowel Program Complete? Yes    Patient Tolerated? Yes

## 2023-04-05 NOTE — Progress Notes (Signed)
Patient ID: Lee [name], male   DOB: 08/21/2006, 17 y.o.   MRN: [id number]  SW ordered 30" transfer board and TTB with [employer] via parachute.   Cecile Sheerer, MSW, LCSWA Office: [phone] Cell: [phone] Fax: [phone]

## 2023-04-05 NOTE — Progress Notes (Signed)
Physical Therapy Session Note  Patient Details  Name: Lee [name] MRN: [id number] Date of Birth: September 04, 2006  Today's Date: 04/05/2023 PT Individual Time: 1100-1200 PT Individual Time Calculation (min): 60 min   Short Term Goals: Week 3:  PT Short Term Goal 1 (Week 3): Pt will perform bed/chair transfers with supervision PT Short Term Goal 2 (Week 3): Pt will participate in high level w/c skills training (wheelies, etc.) PT Short Term Goal 3 (Week 3): Pt will perform sit>supine with CGA or better  Skilled Therapeutic Interventions/Progress Updates:    Pt recd sleeping and difficult to rouse. After several minutes pt awake enough to participate and donned ted hose mod I. Pants with assist to thread catheter. Shirt and brace with set up and increased time as pt was talking to grandma on phone about his lunch order.   Pt with intermittent pain in back, managed with positioning and brace, required no further intervention at this time.   Squat pivot to w/c with supervision, with improving clearance noted. Pt propelled w/c throughout with supervision, pt then set up with e stim on BIL quads. Used empi knee setting up to 60.0 on BIL quads x 12 minutes. With this active, pt stood in standing frame ~ 10 minutes. In this position, noted trace activation in L quad that pt could actively control intermittently. Pt also reports being able to feel e stim on L side but not R. Assessed skin afterward, no ill affects noted.   Pt returned to room and remained with family present.   Therapy Documentation Precautions:  Precautions Precautions: Fall, Other (comment), Back Precaution Comments: paraplegia, chest tube Required Braces or Orthoses: Spinal Brace Spinal Brace: Thoracolumbosacral orthotic, Applied in supine position Restrictions Weight Bearing Restrictions: No General:       Therapy/Group: Individual Therapy  [name] 04/05/2023, 12:27 PM

## 2023-04-05 NOTE — Progress Notes (Signed)
Physical Therapy Session Note  Patient Details  Name: Lee [name] MRN: [id number] Date of Birth: 2006/02/15  Today's Date: 04/05/2023 PT Missed Time: 30 Minutes Missed Time Reason: Patient fatigue   Pt sleeping in bed with his partner. Curtain drawn. Does not wake up with knocking or calling his name. Allowed patient to rest. Missed 30 minutes of therapy.      Therapy/Group: Individual Therapy  Cherylanne Ardelean P Nahiara Kretzschmar 04/05/2023, 1:00 PM
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
## 2023-04-05 NOTE — Progress Notes (Signed)
Occupational Therapy Session Note  Patient Details  Name: Lee [name] MRN: [id number] Date of Birth: Aug 06, 2006  Today's Date: 04/05/2023 OT Individual Time: 1300-1345 OT Individual Time Calculation (min): 45 min    Short Term Goals: Week 1:  OT Short Term Goal 1 (Week 1): Pt will perform long sit mat level with B UE support in prep for LB dressing with CGA OT Short Term Goal 1 - Progress (Week 1): Met OT Short Term Goal 2 (Week 1): Pt will transfer to most appropriate shower seating DME with TB and mod A OT Short Term Goal 2 - Progress (Week 1): Met OT Short Term Goal 3 (Week 1): Pt will sit EOB with CGA for BADL's with min A OT Short Term Goal 3 - Progress (Week 1): Met Week 2:  OT Short Term Goal 1 (Week 2): Pt will complete LB dressing at bed level with Min A with AE as necessary OT Short Term Goal 1 - Progress (Week 2): Met OT Short Term Goal 2 (Week 2): Pt will complete lateral transfers with Min A including transfer board placement and leg management OT Short Term Goal 2 - Progress (Week 2): Met OT Short Term Goal 3 (Week 2): Pt will complete bathing with Min A with AE as necessary OT Short Term Goal 3 - Progress (Week 2): Met Week 3:  OT Short Term Goal 1 (Week 3): Pt will self initiate all aspects of UB self care and complete with close S only OT Short Term Goal 1 - Progress (Week 3): Met OT Short Term Goal 2 (Week 3): Family/caregiver will demonstrate safety and competency for shower chair access with 1 staff member SBA and pt self directing OT Short Term Goal 2 - Progress (Week 3): Met Week 4:  OT Short Term Goal 1 (Week 4): STGs=LTGs d/t LOS  Skilled Therapeutic Interventions/Progress Updates:    1:1 Pt received in the bed asleep- easily awakend. Pt donned TLSO with setup of brace on his own in the bed. Pt declined to wear leg loops. Pt came to EOB with supervision. Pt transferred into new loaner chair with contact guard. Pt does require someone to manage his cathter since  he can not hang it himself under the chair. Discussed options for management of catheter. Therapist made a band on left part of the frame of the w/c with OneWrap and pt now able to move and fasten catheter to his chair on his own. Problem solved and discussed furniture transfers with v without slide board and proper board placement. Pt able to transfer to couch with min guard without board but required board to get back into w/c with contact guard. Went down to the atrium and practiced getting in and out of a booth- pt able to perform with min cues for problem solving - min guard to the booth without board - contact guard with board back into w/c. Further educated and discussed safe transfers to not bump bottom on wheel of chair to decr chance of deep pressure injuries. Discussed community reentry. Pt also did report that despite the nature of his injury - he feels safe to return home. Returned to room and tranfser back into bed with min A for management of LEs.  Therapy Documentation Precautions:  Precautions Precautions: Fall, Other (comment), Back Precaution Comments: paraplegia, chest tube Required Braces or Orthoses: Spinal Brace Spinal Brace: Thoracolumbosacral orthotic, Applied in supine position Restrictions Weight Bearing Restrictions: No    Pain:  No c/o pain in session  Therapy/Group: Individual Therapy  Roney Mans [facility] 04/05/2023, 2:15 PM

## 2023-04-05 NOTE — Progress Notes (Signed)
Occupational Therapy Note  Patient Details  Name: Lee [name] MRN: [id number] Date of Birth: 09/18/2006  Today's Date: 04/05/2023 OT Missed Time: 60 Minutes Missed Time Reason: Patient unwilling/refused to participate without medical reason  Pt asleep in the bed with girlfriend. Tried to engage pt in functional tasks with options. Donned TEDS. Pt declined therapy at this time wanting to continue to rest. Miss 60 min   Roney Mans [facility] 04/05/2023, 11:01 AM

## 2023-04-06 ENCOUNTER — Inpatient Hospital Stay (HOSPITAL_COMMUNITY): Payer: Medicaid Other

## 2023-04-06 MED ORDER — ACETAMINOPHEN 325 MG PO TABS: 325.0000 mg | ORAL_TABLET | ORAL | Status: DC | PRN

## 2023-04-06 MED ORDER — BISACODYL 10 MG RE SUPP: 10.0000 mg | Freq: Every day | RECTAL | Status: DC

## 2023-04-06 MED ORDER — SENNA 8.6 MG PO TABS: 3.0000 | ORAL_TABLET | Freq: Every day | ORAL | Status: DC

## 2023-04-06 MED ORDER — DOCUSATE SODIUM 100 MG PO CAPS: 100.0000 mg | ORAL_CAPSULE | Freq: Every day | ORAL | Status: DC

## 2023-04-06 MED ORDER — APIXABAN 2.5 MG PO TABS
2.5000 mg | ORAL_TABLET | Freq: Two times a day (BID) | ORAL | Status: DC
  Administered 2023-04-06 – 2023-04-08 (×5): 2.5 mg via ORAL
  Filled 2023-04-06 (×5): qty 1

## 2023-04-06 MED ORDER — GABAPENTIN 400 MG PO CAPS
800.0000 mg | ORAL_CAPSULE | Freq: Three times a day (TID) | ORAL | Status: DC
  Administered 2023-04-06 – 2023-04-08 (×5): 800 mg via ORAL
  Filled 2023-04-06 (×5): qty 2

## 2023-04-06 MED ORDER — PSYLLIUM 95 % PO PACK: 1.0000 | PACK | Freq: Every day | ORAL | Status: DC

## 2023-04-06 NOTE — Progress Notes (Signed)
Physical Therapy Session Note  Patient Details  Name: Lee [name] MRN: [id number] Date of Birth: Jan 30, 2006  Today's Date: 04/06/2023 PT Individual Time: 1445-1530 PT Individual Time Calculation (min): 45 min   Short Term Goals: Week 3:  PT Short Term Goal 1 (Week 3): Pt will perform bed/chair transfers with supervision PT Short Term Goal 2 (Week 3): Pt will participate in high level w/c skills training (wheelies, etc.) PT Short Term Goal 3 (Week 3): Pt will perform sit>supine with CGA or better  Skilled Therapeutic Interventions/Progress Updates:      Therapy Documentation Precautions:  Precautions Precautions: Fall, Other (comment), Back Precaution Comments: paraplegia, chest tube Required Braces or Orthoses: Spinal Brace Spinal Brace: Thoracolumbosacral orthotic, Applied in supine position Restrictions Weight Bearing Restrictions: No  Pt received semi-reclined in bed with girlfriend [name] present and both agreeable to practice real life car transfer. Pt without reports of pain in session and requires min A for dressing for time management and min A for LE management with supine to sit (pt without leg loops). Pt (S) for slide bord placement and with transfer to w/c and dependently transported to main hospital entrance for energy conservation. Pt (S) with slide board transfer from w/c <>front passenger's car seat. Pt reports he has performed with family before and aunt reports he has a good technique. Following transfer PT recommended use of leg loops and slide board depending on pt's fatigue level and provided education on energy conservation as pt reports originally performed transfer without board with family. Pt transported to room and attempted squat pivot to bed and required mod A to prevent fall. PT encouraged pt to try transfer again with use of slide board and pt politely declined and performed squat pivot with (S). Pt left semi-reclined in bed with caregiver present and all  needs within reach.    Therapy/Group: Individual Therapy  Truitt Leep Truitt Leep PT, DPT  04/06/2023, 6:15 AM

## 2023-04-06 NOTE — Progress Notes (Signed)
Patient ID: Lee [name], male   DOB: [birth year], 17 y.o.   MRN: [id number]  SW went by pt room to provide updates, and pt not present in room.   *SW later informed that pt was in shower.   1322-SW spoke with pt mother Lee [name] and pt aunt Lee [name] to provide updates from team conference. They require clarification on which medications will be offered only 7 days. SW will send outpatient PT/OT referral to [facility]. DME indicates as delivered - transfer  board and TTB. Aware the permanent chair from [facility] will arrive closer towards the end of the month.   Cecile Sheerer, MSW, LCSWA Office: [phone] Cell: [phone] Fax: [phone]

## 2023-04-06 NOTE — Progress Notes (Signed)
Physical Therapy Session Note  Patient Details  Name: Lee [name] MRN: [id number] Date of Birth: 2006-04-21  Today's Date: 04/06/2023 PT Individual Time: 1000-1100 PT Individual Time Calculation (min): 60 min   Short Term Goals: Week 3:  PT Short Term Goal 1 (Week 3): Pt will perform bed/chair transfers with supervision PT Short Term Goal 2 (Week 3): Pt will participate in high level w/c skills training (wheelies, etc.) PT Short Term Goal 3 (Week 3): Pt will perform sit>supine with CGA or better  Skilled Therapeutic Interventions/Progress Updates:    Pt recd in bed, reports pain with mobility that interfered with motion at times, nsg made aware.   Pt donned ted hose with set up. Bed mobility with encouragement and occ cueing. Squat pivot with distant supervision and set up. Continued education on wearing shoes and good LE management to protect feet.   Pt instructed on and progressed curb navigation on 4" curb. Pt able to wheelie casters onto curb with CGA and then able to climb onto curb with as little as min A (largely mod A).    Transitioned to mat table in same manner as above. Therapist demoed floor to chair transfers, but did not attempt d/t TLSO/back precautions. Also discussed using "stair step" method, and trialled on 8" folded floor mat on mat table. Required min A at times and reported increased back pain in circle sit and long sit positions.  Pt returned to room and remained in chair, was left with all needs in reach and alarm active.   Therapy Documentation Precautions:  Precautions Precautions: Fall, Other (comment), Back Precaution Comments: paraplegia, chest tube Required Braces or Orthoses: Spinal Brace Spinal Brace: Thoracolumbosacral orthotic, Applied in supine position Restrictions Weight Bearing Restrictions: No General:      Therapy/Group: Individual Therapy  [name] 04/06/2023, 3:36 PM
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
## 2023-04-06 NOTE — Progress Notes (Signed)
Physical Therapy Discharge Summary  Patient Details  Name: Lee [name] MRN: [id number] Date of Birth: 01-02-2006  Date of Discharge from PT service:April 07, 2023     Patient has met 7 of 7 long term goals due to improved activity tolerance, improved balance, improved postural control, and ability to compensate for deficits.  Patient to discharge at a wheelchair level Modified Independent.   Patient's care partner is independent to provide the necessary physical assistance at discharge. Pt to d/c home with his aunt and with support from various family members. Family education has been completed.   Reasons goals not met: NA  Recommendation:  Patient will benefit from ongoing skilled PT services in outpatient setting to continue to advance safe functional mobility, address ongoing impairments in transfer ability, education for SCI related issues, strength, balance, maintain ROM, spasticity management, and minimize fall risk.  Equipment: Specialty wheelchair through [employer] medical  Reasons for discharge: treatment goals met and discharge from hospital  Patient/family agrees with progress made and goals achieved: Yes  PT Discharge Precautions/Restrictions Precautions Precautions: Fall;Back Precaution Comments: paraplegia Required Braces or Orthoses: Spinal Brace Spinal Brace: Thoracolumbosacral orthotic;Applied in supine position Restrictions Weight Bearing Restrictions: No  Pain Interference Pain Interference Pain Effect on Sleep: 4. Almost constantly Pain Interference with Therapy Activities: 3. Frequently Pain Interference with Day-to-Day Activities: 2. Occasionally Vision/Perception  Vision - History Ability to See in Adequate Light: 0 Adequate Perception Perception: Within Functional Limits Praxis Praxis: Intact  Cognition Overall Cognitive Status: Within Functional Limits for tasks assessed Arousal/Alertness: Awake/alert Orientation Level: Oriented  X4 Safety/Judgment: Appears intact Sensation Sensation Light Touch: Impaired by gross assessment Hot/Cold: Impaired by gross assessment Proprioception: Impaired by gross assessment Stereognosis: Appears Intact Coordination Gross Motor Movements are Fluid and Coordinated: No Coordination and Movement Description: paraplegia Motor  Motor Motor: Paraplegia Motor - Discharge Observations: improved trunk control for static and dynamic tasks but continues to require support  Mobility Bed Mobility Bed Mobility: Rolling Right;Rolling Left;Supine to Sit;Sit to Supine Rolling Right: Independent with assistive device Rolling Left: Independent with assistive device Supine to Sit: Independent with assistive device Sit to Supine: Independent with assistive device Transfers Transfers: Lateral/Scoot Transfers Lateral/Scoot Transfers: Independent with assistive device (mod I with slideboard, supervision squat pivot) Locomotion  Gait Ambulation: No Gait Gait: No Stairs / Additional Locomotion Stairs: No Pick up small object from the floor (from standing position) activity did not occur: Safety/medical concerns Naval architect Mobility: Yes Wheelchair Assistance: Independent with Scientist, research (life sciences): Both upper extremities Wheelchair Parts Management: Independent Distance: >1000 ft  Trunk/Postural Assessment  Cervical Assessment Cervical Assessment: Within Functional Limits Thoracic Assessment Thoracic Assessment: Exceptions to [facility] Thoracic Strength Overall Thoracic Strength: Deficits Overall Thoracic Strength Comments: decreased Lumbar Assessment Lumbar Assessment: Exceptions to [facility] Lumbar Strength Overall Lumbar Strength: Deficits Overall Lumbar Strength Comments: decreased Postural Control Postural Control: Deficits on evaluation Trunk Control: decreased overall but improved from eval  Balance Balance Balance Assessed: Yes Static Sitting  Balance Static Sitting - Balance Support: Bilateral upper extremity supported Static Sitting - Level of Assistance: 5: Stand by assistance Dynamic Sitting Balance Dynamic Sitting - Balance Support: Bilateral upper extremity supported Dynamic Sitting - Level of Assistance: 5: Stand by assistance Extremity Assessment      RLE Assessment RLE Assessment: Exceptions to [facility] General Strength Comments: 0/5 grossly RLE LLE Assessment LLE Assessment: Exceptions to [facility] General Strength Comments: 0/5 grossly   Lee [name] 04/08/2023, 3:23 PM

## 2023-04-06 NOTE — Progress Notes (Signed)
Occupational Therapy Session Note  Patient Details  Name: Lee [name] MRN: [id number] Date of Birth: Jul 13, 2006  Today's Date: 04/06/2023 OT Individual Time: 0900-0945 1st Session; 1340-1410 2nd Session  OT Individual Time Calculation (min): 45 min, 30 min, missed min 30 min    Short Term Goals: Week 4:  OT Short Term Goal 1 (Week 4): STGs=LTGs d/t LOS  Skilled Therapeutic Interventions/Progress Updates:    Session 1:  Pt seen for skilled OT session with focus on pt motivation and dressing skills. Pt bed level with GF present upon OT arrival. OT set up clothing for pt to self motivate. OT establishing UE HEP thus allowed pt and GF to demonstrate skills if self-initiating am routine. Pt with improved with transfers and dressing skills, family able to assist with bathing and set up but following routine, rehab schedule and motivation continues to be the biggest barrier espacially in the am. Written UE HEP x 3 copies with plastic covering for protection for home use carryover. Pt remained in bed resting until PT but dressed.   Pain: requested pain meds for 8/10 back pain and nursing alerted    Session 2:  OT arrival with pt in w/c preparing to transfer w/c to TTB. OT ensured safety as pt has been using rolling shower chair and GF cleared to assist but not on TTB alone without NT or staff present. OT continues to rec NT presence or staff for safety with CGA needed. Missed 30 min of session d/t GF and he showering together after transfer training on padded Ttb in stall shower. OT went to gather HEP items for d/c training and both pt and GF did not get out of shower prior to OT session ending. Will attempt to make up missed minutes if able. Care coord with PT clinician who is working with pt next for update.   Pain: un rated back pain reported but meds had been administered and warm shower for relief   Therapy Documentation Precautions:  Precautions Precautions: Fall, Other (comment),  Back Precaution Comments: paraplegia, chest tube Required Braces or Orthoses: Spinal Brace Spinal Brace: Thoracolumbosacral orthotic, Applied in supine position Restrictions Weight Bearing Restrictions: No    Therapy/Group: Individual Therapy  [name] 04/06/2023, 7:45 AM

## 2023-04-06 NOTE — Progress Notes (Signed)
Pt in wheelchair at door without family with him. Nurse asked what he was doing, he stated "I'm just going out here". Nurse asked if wanted to go to snack machine, pat stated yes. Nurse went with patient to snack machine, pt used wheel chair to go to snack machine then stated "I dont want anything from this one". Nurse offered to take him to another one, pt declined and was taken back to room where male was laying in bed.
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
## 2023-04-07 ENCOUNTER — Other Ambulatory Visit (HOSPITAL_COMMUNITY): Payer: Self-pay

## 2023-04-07 MED ORDER — TOPIRAMATE 25 MG PO TABS
25.0000 mg | ORAL_TABLET | Freq: Every day | ORAL | 0 refills | Status: DC
  Filled 2023-04-07: qty 30, 30d supply, fill #0

## 2023-04-07 MED ORDER — MORPHINE SULFATE ER 15 MG PO TBCR
15.0000 mg | EXTENDED_RELEASE_TABLET | Freq: Two times a day (BID) | ORAL | 0 refills | Status: DC
  Filled 2023-04-07: qty 28, 14d supply, fill #0

## 2023-04-07 MED ORDER — APIXABAN 2.5 MG PO TABS
2.5000 mg | ORAL_TABLET | Freq: Two times a day (BID) | ORAL | 1 refills | Status: DC
Start: 2023-04-07 — End: 2023-04-21
  Filled 2023-04-07: qty 60, 30d supply, fill #0

## 2023-04-07 MED ORDER — OXYCODONE HCL 10 MG PO TABS
10.0000 mg | ORAL_TABLET | ORAL | 0 refills | Status: DC | PRN
  Filled 2023-04-07: qty 20, 5d supply, fill #0

## 2023-04-07 MED ORDER — OXYCODONE HCL 5 MG PO TABS
10.0000 mg | ORAL_TABLET | ORAL | Status: DC | PRN
  Administered 2023-04-07: 10 mg via ORAL
  Filled 2023-04-07: qty 2

## 2023-04-07 MED ORDER — LIDOCAINE 4 % EX PTCH
3.0000 | MEDICATED_PATCH | CUTANEOUS | 0 refills | Status: AC
Start: 2023-04-07 — End: ?
  Filled 2023-04-07: qty 60, 20d supply, fill #0
  Filled 2023-04-07: qty 18, 6d supply, fill #0

## 2023-04-07 MED ORDER — GABAPENTIN 400 MG PO CAPS
800.0000 mg | ORAL_CAPSULE | Freq: Three times a day (TID) | ORAL | 0 refills | Status: DC
  Filled 2023-04-07: qty 180, 30d supply, fill #0

## 2023-04-07 MED ORDER — OXYCODONE HCL 10 MG PO TABS
10.0000 mg | ORAL_TABLET | ORAL | 0 refills | Status: DC | PRN
  Filled 2023-04-07: qty 30, 4d supply, fill #0

## 2023-04-07 MED ORDER — METHOCARBAMOL 500 MG PO TABS
500.0000 mg | ORAL_TABLET | Freq: Four times a day (QID) | ORAL | 0 refills | Status: DC | PRN
  Filled 2023-04-07: qty 90, 23d supply, fill #0

## 2023-04-07 NOTE — Progress Notes (Signed)
Occupational Therapy Discharge Summary  Patient Details  Name: Lee [name] MRN: [id number] Date of Birth: 12-13-2005  Date of Discharge from OT service:April 07, 2023  Today's Date: 04/07/2023 OT Individual Time: 1000-1015 1st Session, 1500-1530 2nd Session  OT Individual Time Calculation (min): 15 min (missed 45 due to refusal), 30 min    Patient has met 8 of 8 long term goals due to improved activity tolerance, improved balance, postural control, ability to compensate for deficits, functional use of  RIGHT upper and RIGHT lower extremity, and improved coordination.  Patient to discharge at [facility] Assist level.  Patient's care partner is independent to provide the necessary physical assistance at discharge.    Reasons goals not met: pt still has Foley and needs support for bowel program but caregiver's indep for carryover   Recommendation:  Patient will benefit from ongoing skilled OT services in outpatient setting to continue to advance functional skills in the area of BADL, iADL, School/Education, Vocation, and Reduce care partner burden.  Equipment: TB, TTB, loaner lightweight manual w/c with custom cushion   Reasons for discharge: treatment goals met  Patient/family agrees with progress made and goals achieved: Yes  OT Discharge Precautions/Restrictions  Precautions Precautions: Fall;Back Precaution Comments: paraplegia Required Braces or Orthoses: Spinal Brace Restrictions Weight Bearing Restrictions: No General OT Amount of Missed Time: 45 Minutes    Pain Pain Assessment Pain Scale: 0-10 Pain Score: 8  Pain Type: Acute pain Pain Location: Back Pain Orientation: Lower Pain Descriptors / Indicators: Sharp Pain Frequency: Constant Pain Onset: On-going Patients Stated Pain Goal: 2 Pain Intervention(s): Pain med given for lower pain score than stated, per patient request;Repositioned;Rest;Shower;Relaxation Multiple Pain Sites: No ADL ADL Equipment Provided:  Long-handled sponge Eating: Independent Where Assessed-Eating: Wheelchair Grooming: Independent Where Assessed-Grooming: Sitting at sink, Wheelchair Upper Body Bathing: Supervision/safety Where Assessed-Upper Body Bathing: Shower Lower Body Bathing: Minimal assistance Where Assessed-Lower Body Bathing: Shower Upper Body Dressing: Independent Where Assessed-Upper Body Dressing: Wheelchair Lower Body Dressing: Supervision/safety Where Assessed-Lower Body Dressing: Bed level Toileting: Minimal assistance Where Assessed-Toileting: Bed level, Bedside Commode Toilet Transfer: Close supervision Toilet Transfer Method: Scientist, research (life sciences): Other (comment) (padded TTB) Tub/Shower Transfer: Scientific laboratory technician Method: Psychologist, prison and probation services: Insurance underwriter: Administrator, arts Method: Best boy: Emergency planning/management officer ADL Comments: overall CGA for transfers to padded and regular TTB, bed level LB dressing with leg loops with CGA, shower with LH sponge with CGA, Foley catheter for urine and bowel program with assist for bowel mngt Vision Baseline Vision/History: 0 No visual deficits Patient Visual Report: No change from baseline Vision Assessment?: No apparent visual deficits Perception  Perception: Within Functional Limits Praxis Praxis: Intact Cognition Cognition Overall Cognitive Status: Within Functional Limits for tasks assessed Arousal/Alertness: Awake/alert Memory: Appears intact Awareness: Appears intact Problem Solving: Appears intact Safety/Judgment: Appears intact Brief Interview for Mental Status (BIMS) Repetition of Three Words (First Attempt): 3 Temporal Orientation: Year: Correct Temporal Orientation: Month: Accurate within 5 days Temporal Orientation: Day: Correct Recall: "Sock": Yes, no cue required Recall: "Blue": Yes, no cue required Recall:  "Bed": Yes, no cue required BIMS Summary Score: 15 Sensation Sensation Light Touch: Impaired by gross assessment Hot/Cold: Impaired by gross assessment Proprioception: Impaired by gross assessment Stereognosis: Appears Intact Coordination Gross Motor Movements are Fluid and Coordinated: No Fine Motor Movements are Fluid and Coordinated: Yes Coordination and Movement Description: paraplegia Finger Nose Finger Test: WNL Bly Motor  Motor Motor: Paraplegia  Motor - Discharge Observations: improved trunk control for static and dynamic tasks but continues to require support Mobility  Bed Mobility Bed Mobility: Rolling Right;Rolling Left;Supine to Sit;Sit to Supine Rolling Right: Contact Guard/Touching assist  Trunk/Postural Assessment  Cervical Assessment Cervical Assessment: Within Functional Limits Thoracic Assessment Thoracic Assessment: Exceptions to [facility] Thoracic Strength Overall Thoracic Strength: Deficits Overall Thoracic Strength Comments: decreased Lumbar Strength Overall Lumbar Strength: Deficits Overall Lumbar Strength Comments: decreased Postural Control Trunk Control: decreased overall but improved from eval Protective Responses: decreased Postural Limitations: decreased  Balance   Extremity/Trunk Assessment RUE Assessment RUE Assessment: Within Functional Limits LUE Assessment LUE Assessment: Within Functional Limits Session 1:  Pt seen for brief am session to initiate d/c training this am. BIMS conducted and Care Tool scored. Pt bed level with GF and refused further am session c/o fatigue. OT reinforced written UE HEP and requested pt review in preparation for final session later day as well as to be prepare for TTB training in tub room. Pt verbalized understanding and agreement. Left pt bed level with all safety measures. Missed 45 min due to refusal and fatigue.    Session 2:  Pt in shower with GF and dad present for caregiver training session for UE HEP.  Issued and trained in comprehensive tband, weights, foam resistance cubes, theraputty, seated push ups and breathing HEP. Rec 10-20 reps 2-3x per day. Dad able to teach back all therex. OT recommends CGA for all transfers for safety with use of TB. Dad confirms rec and offered apology for pt's lack of responsibility to follow and adhere to daily schedule which limited last several sessions participation. No further OT needs in CIR with rec for d/c home with 24 hr A and outpt OT. All needed DME in room for pt to take home tomorrow. Dad satisfied with session and d/c needs with no further questions or concerns at this time. NT and nursing aware pt in shower with GF.    [name] 04/07/2023, 4:59 PM

## 2023-04-07 NOTE — Progress Notes (Signed)
IP Rehab Bowel Program Documentation   Bowel Program Start time [phone]  Dig Stim Indicated? Yes  Dig Stim Prior to Suppository or mini Enema X 1   Output from dig stim:   Ordered intervention: Suppository Yes , mini enema No ,   Repeat dig stim after Suppository or Mini enema  X ,  Output?    Bowel Program Complete? No , handoff given yes to evening shift;note shared  Patient Tolerated? Yes

## 2023-04-07 NOTE — TOC Benefit Eligibility Note (Signed)
Pharmacy Patient Advocate Encounter  Received notification from [facility] [employer] that Prior Authorization for Morphine ER 15mg  tabs has been APPROVED from 04/07/23 to 10/08/23.Marland Kitchen  PA #/Case ID/Reference #: [id number]    Received notification from [facility] [employer] that Prior Authorization for Lidocaine 5% has been DENIED because criteria from the health plan guideline, topical local anesthetics, must be met before we can approve the request..  PA #/Case ID/Reference #: [id number]
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
## 2023-04-07 NOTE — Progress Notes (Signed)
Patient ID: Lee [name], male   DOB: [birth year], 17 y.o.   MRN: [id number]  SW faxed outpatient PT/OT referral to [facility] (p:[phone]/f:[phone]).   Cecile Sheerer, MSW, LCSWA Office: [phone] Cell: [phone] Fax: [phone]

## 2023-04-07 NOTE — Plan of Care (Signed)
  Problem: RH Toileting Goal: LTG Patient will perform toileting task (3/3 steps) with assistance level (OT) Description: LTG: Patient will perform toileting task (3/3 steps) with assistance level (OT)  Outcome: Adequate for Discharge

## 2023-04-07 NOTE — Progress Notes (Signed)
Foley replaced as order per MD; 61fr coude used along with lidocaine jelly. Successful insertion and urine return without blood or complaints of pain.

## 2023-04-07 NOTE — Progress Notes (Signed)
Physical Therapy Session Note  Patient Details  Name: Lee [name] MRN: [id number] Date of Birth: 2006-09-16  Today's Date: 04/07/2023 PT Individual Time: 1345-1445 PT Individual Time Calculation (min): 60 min   Short Term Goals: Week 4:    Week 5:     Skilled Therapeutic Interventions/Progress Updates: Tx1: Pt missed 45 min skillled PT due to pt lethargy/refusal. Pt and GF in bed sleeping and not rousing to increased/noise stimuli. PTA left handouts per primary therapist's request for stretching/pressure release/AD.   Tx2: Pt presented in bed with GF present sleeping but able to be roused. Pt agreeable for OOB activity. PTA provided pt with shorts and threaded shorts and foley total A, pt was able to complete remaining with pt rolling to pull pants over hips with supervision. Pt and PTA noted that Mepilex had rolled off pt's back. Nsg advised and came in to reapply as well as provide pain and anti-nausea meds due to pt complaints of lower back pain. Once received pt noted to have incontinent BM requiring PTA to remove shorts and complete peri-care total A for time management. Pt's father arrived and was present for remainder of session. Once completed pt performed rolling and was able to don TLSO with supervision from supine level. Pt then completed bed mobility with supervision from flat surface without features. Pt deferred use of leg loops at this time however was able to manage BLE and complete squat pivot transfer to w/c with CGA. Pt then agreeable to work on pops up/wheelies to curb. Pt also noted to have mildly more impulsive behavior throughout remainder of session. Pt propelled with accelerated speed through hallway then w/c (or foot) got caught and pt came to abrupt stop in front of nsg station. PTA, nsg and pt's father checking on pt stating he was fine with PTA and father during remaining session advising best to wear some sort of footware for protection. Pt's verbalized understanding but  continued to defer wearing shoes. Pt then propelled remaining distance to main gym and worked on wheelies to 4in step. Pt was able to pop onto step but required assist to get back wheels on step. Pt required assist to safely drop backwards off step. This was repeated several times. Pt then propelled back to room and demonstrated improved safety until nearing room as pt began to propel quickly then was unable to stop when got to door and pinched R foot between w/c foot plate and door. In room pt agreeable to remain in w/c as he wanted GF to wash him. Pt left in w/c with RN notified of incident of pt's foot and current needs met.      Therapy Documentation Precautions:  Precautions Precautions: Fall, Other (comment), Back Precaution Comments: paraplegia, chest tube Required Braces or Orthoses: Spinal Brace Spinal Brace: Thoracolumbosacral orthotic, Applied in supine position Restrictions Weight Bearing Restrictions: No General: PT Amount of Missed Time (min): 45 Minutes PT Missed Treatment Reason: Patient unwilling to participate;Patient fatigue Vital Signs:   Pain: Pain Assessment Pain Scale: 0-10 Pain Score: 9  Pain Type: Acute pain Pain Location: Back Pain Orientation: Lower Pain Descriptors / Indicators: Constant Pain Frequency: Constant Pain Onset: On-going Pain Intervention(s): Medication (See eMAR) Mobility:   Locomotion :    Trunk/Postural Assessment :    Balance:   Exercises:   Other Treatments:      Therapy/Group: Individual Therapy  Fritzie Prioleau 04/07/2023, 4:15 PM

## 2023-04-08 NOTE — Progress Notes (Signed)
Recreational Therapy Discharge Summary Patient Details  Name: Lee [name] MRN: [id number] Date of Birth: 06/23/2006 Today's Date: 04/08/2023  Comments on progress toward goals: Pt discharged home today with family to provide the needed supervision/assistance.  TR sessions focused on pt education, coping, activity tolerance & dynamic balance.  Pt discharge home at overall Min assist w/c level. \\Reasons for discharge: discharge from hospital  Follow-up: OUtpatient Patient/family agrees with progress made and goals achieved: Yes  Carizma Dunsworth 04/08/2023, 2:31 PM
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
## 2023-04-08 NOTE — Progress Notes (Signed)
Inpatient Rehabilitation Care Coordinator Discharge Note   Patient Details  Name: Lee [name] MRN: [id number] Date of Birth: 08-Dec-2005   Discharge location: D/c to home with support from various family members  Length of Stay: 27 days  Discharge activity level: Mod I at w/c level  Home/community participation: Limited  Patient response ZH:YQMVHQ Literacy - How often do you need to have someone help you when you read instructions, pamphlets, or other written material from your doctor or pharmacy?: Never  Patient response IO:NGEXBM Isolation - How often do you feel lonely or isolated from those around you?: Never  Services provided included: MD, RD, PT, OT, RN, CM, TR, Pharmacy, Neuropsych, SW  Financial Services:  Field seismologist Utilized: Private Insurance [facility] Medicaid  Choices offered to/list presented to: patient mother and aunt  Follow-up services arranged:  Outpatient, DME    Outpatient Servicies: [facility] for PT/Ot DME : [facility] for transfer board and TTB. [facility] for specialty w/c    Patient response to transportation need: Is the patient able to respond to transportation needs?: Yes In the past 12 months, has lack of transportation kept you from medical appointments or from getting medications?: No In the past 12 months, has lack of transportation kept you from meetings, work, or from getting things needed for daily living?: No   Patient/Family verbalized understanding of follow-up arrangements:  Yes  Individual responsible for coordination of the follow-up plan: contact pt [phone] or pt mother Lee [name] [phone]  Confirmed correct DME delivered: Gretchen Short 04/08/2023    Comments (or additional information):fam edu completed  Summary of Stay    Date/Time Discharge Planning CSW  04/06/23 0954 Pt now has insurance- [facility] Medicaid. Pt will d/c to home with his aunt, and PRN support from dad. PRN support from  various family members. Family meeting held. [facility] referral for diability application submitted. DME ordered- TTB and transfer board through [facility]. Loaner w/c through [employer]. Family edu completed. Pt has been set up for PCP. SW will confirm there are no barriers to discharge. AAC  03/30/23 1005 Pt is uninsured. Pt has a pending Medicaid application. Pt will d/c to home with his father and his aunt. PRN support from various family members. Family meeting held last week Thursday. Family able to obtain TTB and 3in1 BSC. Fam will be here for w/c eval this week. [facility] referral for diability application submitted. He will be set up for [facility] medication assistance program, and charity HH. SW will confirm there are no barriers to discharge. AAC  03/23/23 1028 Pt is uninsured. Pt has a pending Medicaid application. Pt will d/c to home with his father and his aunt. PRN support from various family members. Family meeting held last week Thursday. Family able to obtain TTB and 3in1 BSC. Fam will be here for w/c eval this week. [facility] referral for diability application submitted. He will be set up for [facility] medication assistance program, and charity HH. SW will confirm there are no barriers to discharge. AAC  03/15/23 1439 Pt is uninsured. Pt has a pending Medicaid application. Pt will d/c to home with his father and his aunt. PRN support from various family members. SW waiting on updates from [facility] to see if they can accept referral to assist with disability. Family is aware to begin looking for DME: w/c, TTB, and 3in1 BSC. SW will confirm there are no barriers to discharge. AAC       Natacia Chaisson A Lula Olszewski

## 2023-04-08 NOTE — Plan of Care (Signed)
  Problem: Consults Goal: RH SPINAL CORD INJURY PATIENT EDUCATION Description:  See Patient Education module for education specifics.  Outcome: Progressing   Problem: SCI BOWEL ELIMINATION Goal: RH STG MANAGE BOWEL WITH ASSISTANCE Description: STG Manage Bowel with min Assistance. Outcome: Progressing Goal: RH STG SCI MANAGE BOWEL WITH MEDICATION WITH ASSISTANCE Description: STG SCI Manage bowel with medication with min assistance. Outcome: Progressing Goal: RH STG SCI MANAGE BOWEL PROGRAM W/ASSIST OR AS APPROPRIATE Description: STG SCI Manage bowel program w/min assist or as appropriate. Outcome: Progressing   Problem: SCI BLADDER ELIMINATION Goal: RH STG SCI MANAGE BLADDER PROGRAM W/ASSISTANCE Description: Patient will be able to managed bladder via foley care or I/O cath with min assist  Outcome: Progressing   Problem: RH SKIN INTEGRITY Goal: RH STG SKIN FREE OF INFECTION/BREAKDOWN Description: Wound to back will continue to heal and be free of infection/breakdown with min assist  Outcome: Progressing Goal: RH STG MAINTAIN SKIN INTEGRITY WITH ASSISTANCE Description: STG Maintain Skin Integrity With min Assistance. Outcome: Progressing Goal: RH STG ABLE TO PERFORM INCISION/WOUND CARE W/ASSISTANCE Description: STG Able To Perform Incision/Wound Care With min Assistance. Outcome: Progressing   Problem: RH SAFETY Goal: RH STG ADHERE TO SAFETY PRECAUTIONS W/ASSISTANCE/DEVICE Description: STG Adhere to Safety Precautions With cueing Assistance/Device. Outcome: Progressing Goal: RH STG DECREASED RISK OF FALL WITH ASSISTANCE Description: STG Decreased Risk of Fall With min Assistance. Outcome: Progressing   Problem: RH PAIN MANAGEMENT Goal: RH STG PAIN MANAGED AT OR BELOW PT'S PAIN GOAL Description: Pain will be managed at 4 out of 10 on pain scale with PRN medications min assist  Outcome: Progressing   Problem: RH KNOWLEDGE DEFICIT SCI Goal: RH STG INCREASE KNOWLEDGE OF SELF  CARE AFTER SCI Description: Patient/caregiver will be able to manage medications and self care from nursing handouts and nursing education independently  Outcome: Progressing

## 2023-04-08 NOTE — Progress Notes (Signed)
Patient family member inquired about foley care for patient and proceeded to ask for foley wipes. Patient family member was educated by Press photographer and other staff members that home care for foley is to use the same technique that is used with the foley wipes but to use soap and water. Patient family also inquired about discharge time and staff stated that it is preferred for discharge between 9 am and 11 am. Cletis Media, RN

## 2023-04-08 NOTE — Progress Notes (Signed)
Inpatient Rehabilitation Discharge Medication Review by a Pharmacist  A complete drug regimen review was completed for this patient to identify any potential clinically significant medication issues.  High Risk Drug Classes Is patient taking? Indication by Medication  Antipsychotic No   Anticoagulant Yes Apixaban - VTE ppx   Antibiotic No   Opioid Yes Morphine SR, oxycodone prn - pain  Antiplatelet No   Hypoglycemics/insulin No   Vasoactive Medication No   Chemotherapy No   Other Yes Gabapentin - pain Lidocaine patch - pain Methocarbamol - prn spasms Topiramate - headaches     Type of Medication Issue Identified Description of Issue Recommendation(s)  Drug Interaction(s) (clinically significant)     Duplicate Therapy     Allergy     No Medication Administration End Date     Incorrect Dose     Additional Drug Therapy Needed     Significant med changes from prior encounter (inform family/care partners about these prior to discharge).    Other       Clinically significant medication issues were identified that warrant physician communication and completion of prescribed/recommended actions by midnight of the next day:  No  Name of provider notified for urgent issues identified:   Provider Method of Notification:     Pharmacist comments: None  Time spent performing this drug regimen review (minutes):  20 minutes  Okey Regal, PharmD

## 2023-04-08 NOTE — Progress Notes (Signed)
Pt refusing CHG and Foley care. Nursing educated patient on importance of foley care maintenance as pt is set to D/c this am. Pt seems uninterested in education. PA notified.

## 2023-04-12 ENCOUNTER — Telehealth: Payer: Self-pay | Admitting: *Deleted

## 2023-04-12 DIAGNOSIS — N319 Neuromuscular dysfunction of bladder, unspecified: Secondary | ICD-10-CM

## 2023-04-12 DIAGNOSIS — Z978 Presence of other specified devices: Secondary | ICD-10-CM

## 2023-04-12 DIAGNOSIS — G8220 Paraplegia, unspecified: Secondary | ICD-10-CM

## 2023-04-12 MED ORDER — PROMETHAZINE HCL 12.5 MG PO TABS: 12.5000 mg | ORAL_TABLET | Freq: Four times a day (QID) | ORAL | 5 refills | Status: DC | PRN

## 2023-04-12 NOTE — Telephone Encounter (Signed)
Call back to [phone]. I told aunt that patient has to give permission to speak with her.

## 2023-04-12 NOTE — Telephone Encounter (Signed)
Patient's aunt requesting a callback [phone]. She says you saw patient in hospital. Aunt not listed on pt contacts.

## 2023-04-12 NOTE — Telephone Encounter (Signed)
Attempted to call aunt Myisha at 2 different numbers- wasn't able to get in touch with her- went straight to voicemail that was full on 1 number and other said person wasn't accepting calls.   Have attempted 3 separate times to contact aunt- was unable to reach all 3 times. attempted at 2:23, 2:24  and 2:27-   Will try again in future as able.

## 2023-04-12 NOTE — Telephone Encounter (Signed)
Aunt Myisha called back whom is not the contact person nor no dpr. She states that patient got an erection for the first time the other day and since then his testicles have been swollen, painful and warm to touch. He is also nauseated.  She also was asking about his bowel program-how much is enough to get out or as long as she gets something is that fine?

## 2023-04-13 ENCOUNTER — Ambulatory Visit: Payer: Self-pay | Admitting: *Deleted
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
## 2023-04-13 NOTE — Telephone Encounter (Signed)
Reason for Disposition  Scrotum is painful or tender to touch  Answer Assessment - Initial Assessment Questions 1. LOCATION and RADIATION: "Where is the pain located?"      Swelling in testicles, first erection this weekend 2. QUALITY: "What does the pain feel like?"  (e.g., sharp, dull, aching, burning)     Continuous pain- hurts more with movement 3. SEVERITY: "How bad is the pain?"  (Scale 1-10; or mild, moderate, severe)   - MILD (1-3): doesn't interfere with normal activities    - MODERATE (4-7): interferes with normal activities (e.g., work or school) or awakens from sleep   - SEVERE (8-10): excruciating pain, unable to do any normal activities, difficulty walking     N/a 4. ONSET: "When did the pain start?"     Over the weekend- pain 5. PATTERN: "Does it come and go, or has it been constant since it started?"     Comes and goes- worse with movement  6. SCROTAL APPEARANCE: "What does the scrotum look like?" "Is there any swelling or redness?"      Swelling- bilateral , right side larger 7. HERNIA: "Has a doctor ever told you that you have a hernia?"     no 8. OTHER SYMPTOMS: "Do you have any other symptoms?" (e.g., abdomen pain, difficulty passing urine, fever, vomiting)     Bright yellow discharge today  Answer Assessment - Initial Assessment Questions 1. SCROTAL SWELLING: "What does the scrotum look like?" "How swollen is it?" (mild, moderate severe; compare to other side)     Mild/moderate swelling- discomfort 2. LOCATION: "Where is the swelling located?"     testicles 3. ONSET: "When did the swelling start?"     Over the weekend 4. PATTERN: "Does it come and go, or has it been constant since it started?"     *No Answer* 5. SCROTAL PAIN: "Is there any pain?" If Yes, ask: "How bad is it?"  (Scale 1-10; or mild, moderate, severe)     Pain with touch, movement 6. HERNIA: "Has a doctor ever told you that you have a hernia?"     N/a 7. OTHER SYMPTOMS: "Do you have any other  symptoms?" (e.g., abdomen pain, difficulty passing urine, fever, vomiting)     no  Protocols used: Scrotum Pain-A-AH, Scrotum Swelling-A-AH

## 2023-04-14 ENCOUNTER — Encounter: Payer: Self-pay | Admitting: Occupational Therapy
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
## 2023-04-16 ENCOUNTER — Ambulatory Visit: Payer: Medicaid Other | Attending: Physician Assistant | Admitting: Physical Therapy

## 2023-04-16 ENCOUNTER — Encounter: Payer: Self-pay | Admitting: Physical Therapy

## 2023-04-16 DIAGNOSIS — S24104A Unspecified injury at T11-T12 level of thoracic spinal cord, initial encounter: Secondary | ICD-10-CM

## 2023-04-16 DIAGNOSIS — M6281 Muscle weakness (generalized): Secondary | ICD-10-CM
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
## 2023-04-19 ENCOUNTER — Encounter: Payer: Self-pay | Admitting: Physical Therapy

## 2023-04-19 ENCOUNTER — Ambulatory Visit: Payer: Medicaid Other | Admitting: Occupational Therapy

## 2023-04-21 ENCOUNTER — Ambulatory Visit: Payer: Medicaid Other | Attending: Family Medicine | Admitting: Family Medicine

## 2023-04-21 ENCOUNTER — Encounter: Payer: Self-pay | Admitting: Family Medicine

## 2023-04-21 ENCOUNTER — Encounter
Payer: Medicaid Other | Attending: Physical Medicine and Rehabilitation | Admitting: Physical Medicine and Rehabilitation

## 2023-04-21 ENCOUNTER — Encounter: Payer: Self-pay | Admitting: Physical Medicine and Rehabilitation

## 2023-04-21 ENCOUNTER — Telehealth: Payer: Self-pay

## 2023-04-21 ENCOUNTER — Telehealth: Payer: Self-pay | Admitting: *Deleted

## 2023-04-21 VITALS — BP 112/74 | HR 106 | Ht 69.0 in

## 2023-04-21 VITALS — BP 112/75 | HR 100

## 2023-04-21 DIAGNOSIS — Z5181 Encounter for therapeutic drug level monitoring: Secondary | ICD-10-CM | POA: Diagnosis present

## 2023-04-21 DIAGNOSIS — Z993 Dependence on wheelchair: Secondary | ICD-10-CM | POA: Diagnosis present

## 2023-04-21 DIAGNOSIS — F4320 Adjustment disorder, unspecified: Secondary | ICD-10-CM | POA: Diagnosis present

## 2023-04-21 DIAGNOSIS — Z79891 Long term (current) use of opiate analgesic: Secondary | ICD-10-CM | POA: Diagnosis present

## 2023-04-21 DIAGNOSIS — N319 Neuromuscular dysfunction of bladder, unspecified: Secondary | ICD-10-CM | POA: Diagnosis not present

## 2023-04-21 DIAGNOSIS — G8221 Paraplegia, complete: Secondary | ICD-10-CM

## 2023-04-21 DIAGNOSIS — K592 Neurogenic bowel, not elsewhere classified: Secondary | ICD-10-CM | POA: Insufficient documentation

## 2023-04-21 DIAGNOSIS — N3000 Acute cystitis without hematuria: Secondary | ICD-10-CM

## 2023-04-21 DIAGNOSIS — G894 Chronic pain syndrome: Secondary | ICD-10-CM | POA: Insufficient documentation

## 2023-04-21 DIAGNOSIS — R519 Headache, unspecified: Secondary | ICD-10-CM | POA: Diagnosis not present

## 2023-04-21 MED ORDER — APIXABAN 2.5 MG PO TABS: 2.5000 mg | ORAL_TABLET | Freq: Two times a day (BID) | ORAL | 1 refills | Status: DC

## 2023-04-21 MED ORDER — OXYCODONE HCL 10 MG PO TABS: 10.0000 mg | ORAL_TABLET | ORAL | 0 refills | Status: DC | PRN

## 2023-04-21 MED ORDER — MORPHINE SULFATE ER 15 MG PO TBCR: 15.0000 mg | EXTENDED_RELEASE_TABLET | Freq: Two times a day (BID) | ORAL | 0 refills | Status: DC

## 2023-04-21 MED ORDER — GABAPENTIN 400 MG PO CAPS: 800.0000 mg | ORAL_CAPSULE | Freq: Three times a day (TID) | ORAL | 5 refills | Status: DC

## 2023-04-21 MED ORDER — BACLOFEN 10 MG PO TABS: 5.0000 mg | ORAL_TABLET | Freq: Three times a day (TID) | ORAL | 5 refills | Status: DC

## 2023-04-21 MED ORDER — CIPROFLOXACIN HCL 500 MG PO TABS: 500.0000 mg | ORAL_TABLET | Freq: Two times a day (BID) | ORAL | 0 refills | Status: DC

## 2023-04-21 MED ORDER — TOPIRAMATE 25 MG PO TABS: 25.0000 mg | ORAL_TABLET | Freq: Every day | ORAL | 5 refills | Status: DC
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
## 2023-04-21 NOTE — Patient Instructions (Signed)
Neurogenic Bladder  Neurogenic bladder is a bladder control disorder. It is usually caused by problems with the nerves that control the bladder. The brain sends signals through the spinal cord to the muscles in the bladder that start and stop urine flow. With neurogenic bladder, the nerves and muscles do not work together the way they should. This condition may make the bladder overactive, meaning you have trouble holding urine. In other cases, it may make the bladder underactive. This means that you have trouble passing urine. What are the causes? This condition may be caused by nerve damage or a condition that disrupts the signals from your brain to your bladder. Many things can cause these nerve problems, including: A disease that affects the nervous system, such as: Alzheimer's disease. Cerebral palsy. Multiple sclerosis. Diabetes. Parkinson's disease. Damage to your brain or spinal cord. This can come from: Trauma. Tumors. Infection. Surgery. Alcohol abuse. Stroke. A congenital disability that affects the spinal cord. What increases the risk? You are more likely to develop this condition if you have nerve damage or a nerve disorder. What are the signs or symptoms? Signs and symptoms of this condition include: Leaking or gushing urine (incontinence). A sudden, strong urge to pass urine (urgency). Frequent urination during the day and night. Being unable to empty your bladder completely (urinary retention). Frequent urinary tract infections. How is this diagnosed? This condition may be diagnosed based on: Your symptoms and medical history. A physical exam. Records from a bladder diary. You may be asked to keep a record or log of your bladder symptoms and the times that you urinate. You may also have tests, such as: A urine test to check for infection. A bladder scan after you urinate to see how much urine is left in your bladder. Tests to measure your urine flow and see how  well the flow is controlled (urodynamic tests). A procedure that uses a small device with a camera to look through your urethra into your bladder (cystoscopy). A health care provider who specializes in the urinary tract (urologist) may do this test. Imaging tests of your brain or spine, such as MRI or CT scan. How is this treated? Treatment for this condition depends on the cause and the symptoms that you have. Work closely with your health care provider to find the treatments that will improve your quality of life. Treatment options include: Learning ways to control when you urinate, such as: Urinating at scheduled times. Training yourself to delay urination. Exercises to strengthen the muscles that control urine flow (Kegel exercises). Avoiding foods or drinks that make your symptoms worse. Taking medicines to: Stimulate an underactive bladder. Relax an overactive bladder. Treat a urinary tract infection. Learning how to use a thin tube (catheter) to empty your bladder. A catheter is a hollow tube that you pass through your urethra. Procedures to stimulate the nerves that control your bladder. Surgery, if other treatments do not help. Follow these instructions at home: Lifestyle  Keep a bladder diary to find out which foods, liquids, or activities make your symptoms worse. Use your bladder diary to schedule bathroom trips. If you are away from home, plan to be near a bathroom when your schedule says you will need one. Limit beverages that stimulate urination. These include soda, coffee, and tea. After urinating, wait a few minutes and try again. Make sure you urinate just before you leave the house and just before you go to bed. Kegel exercises Do Kegel exercises to strengthen the muscles  that control the passing of urine. These muscles are the ones you use to try to hold urine when you need to urinate. To do Kegel exercises: Squeeze your pelvic floor muscles tight, as if you are trying  to stop the flow of urine. You should feel a tight lift in your rectal area. If you are male, you should also feel a tightness in your vaginal area. Keep your stomach, buttocks, and legs relaxed. Hold the muscles tight for 5-10 seconds. Relax your muscles for the same amount of time. Repeat 10 times. Repeat this exercise 3 times a day or as many times as told by your health care provider. General instructions Take over-the-counter and prescription medicines only as told by your health care provider. Keep all follow-up visits. This is important. Contact a health care provider if: You are having a hard time controlling your symptoms. Your symptoms are getting worse. You have signs of a urinary tract infection. These may include: A burning feeling when you urinate. Fever or chills. Cloudy or bloody urine. Get help right away if: You cannot pass urine. Summary Neurogenic bladder is a bladder control disorder caused by problems with the nerves that control the bladder. This condition may make the bladder overactive or underactive. This condition may be caused by nerve damage or a condition that disrupts the signals from your brain to your bladder. Treatment depends on the cause of your neurogenic bladder and the symptoms that you have. Work closely with your health care provider to find the treatments that will improve your quality of life. This information is not intended to replace advice given to you by your health care provider. Make sure you discuss any questions you have with your health care provider. Document Revised: 05/23/2020 Document Reviewed: 05/23/2020 Elsevier Patient Education  2024 [employer].

## 2023-04-21 NOTE — Telephone Encounter (Signed)
Lee [name] (KeyDebria Garret) - [id number] oxyCODONE HCl 10MG  tablets Status: PA Response - Approved
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
## 2023-04-21 NOTE — Telephone Encounter (Signed)
Lee [name] (Lee [name]) - [id number] oxyCODONE HCl 10MG  tablets Status: PA RequestCreated: July 31st, 2024 [phone]

## 2023-04-21 NOTE — Telephone Encounter (Signed)
I spoke to the patient and informed him that I received the [facility] referral and we will send it to [facility] after it is completed.  [facility] will then contact him and will do an in home assessment to determine the number of hour he is eligible for.  When approved, he will then choose the agency that he would like to provide his services.    The agency that was noted with the application is [facility] : [phone]

## 2023-04-22 ENCOUNTER — Ambulatory Visit: Payer: Medicaid Other | Admitting: Physical Therapy

## 2023-04-26 ENCOUNTER — Telehealth: Payer: Self-pay | Admitting: *Deleted

## 2023-04-26 ENCOUNTER — Ambulatory Visit: Payer: Medicaid Other | Attending: Physician Assistant | Admitting: Physical Therapy

## 2023-04-26 VITALS — BP 108/59 | HR 95

## 2023-04-26 DIAGNOSIS — G8221 Paraplegia, complete: Secondary | ICD-10-CM | POA: Insufficient documentation

## 2023-04-26 DIAGNOSIS — S24104A Unspecified injury at T11-T12 level of thoracic spinal cord, initial encounter: Secondary | ICD-10-CM | POA: Diagnosis present

## 2023-04-26 DIAGNOSIS — M6281 Muscle weakness (generalized): Secondary | ICD-10-CM | POA: Diagnosis present

## 2023-04-26 NOTE — Telephone Encounter (Signed)
[facility] toxicologist called back and confirmed presence of cocaine in oral swab.

## 2023-04-26 NOTE — Telephone Encounter (Signed)
Called aunt- explained we spoke to Toxicology and they were clear that he's positive for [facility] and Cocaine- - Cocaine only shows up posiitve if used in last 4-5 days at the most.  Aunt says there's no way he would be using, since there are children in the house.  I explained I cannot put my license on the line for someone (+) for multiple substances, esp after he was warned in the hospital to NOT be positive.  And he was warned 1 week and 2 weeks BEFORE d/c- he said wasn't using, but aunt admitted he's been using marijuana, but "hasn't since the hospital".  Explained he needs to have PCP refer him to a pain mgmt doctor, but I cannot due to above issues- he also was reminded to see Dr Wynetta Emery , call and get appointment- also, needs to call [facility] asap to get seen to get Foley changed- needs to get changed by 8/17- CANNOT change foley at my office- we don't have ability.  Hasn't gotten ahold of Urology yet.
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
## 2023-04-26 NOTE — Telephone Encounter (Signed)
Ms Earvin Hansen called and was asking about a new blood thinner that was prescribed. I explained to her that it was baclofen and it is for muscle spasms not a blood thinner. Our call got disconnected because her phone battery died. I called her back to discuss the marijuana and cocaine present in the oral swab, and that we will no longer be able to prescribe narcotics for him.The aunt (Ms Earvin Hansen ) manages his meds and says "there is no way this child has done that". She asks that Dr Berline Chough call her.   I have put a call in to Drug Toxicologist for [facility] to verify the results of the cocaine, but the fact that he is an admitted "heavy smoker of marijuana", he will be still be made non narcotic treatment from our office. We await the call back from the toxicology team.

## 2023-04-28 NOTE — Telephone Encounter (Signed)
PCS referral faxed to [facility]: [phone]

## 2023-04-29 ENCOUNTER — Telehealth: Payer: Self-pay | Admitting: *Deleted

## 2023-04-29 ENCOUNTER — Ambulatory Visit: Payer: Medicaid Other | Admitting: Physical Therapy

## 2023-04-29 NOTE — Telephone Encounter (Signed)
Pt scheduled in our [place] office and will need the lift for cath exchange. Notified the aunt and she specifically asked that it be changed before 05/08/23.  There is no appts in [place] before then

## 2023-04-29 NOTE — Telephone Encounter (Signed)
Scheduled appt in [place]

## 2023-04-30 ENCOUNTER — Other Ambulatory Visit: Payer: Self-pay

## 2023-04-30 ENCOUNTER — Emergency Department (HOSPITAL_BASED_OUTPATIENT_CLINIC_OR_DEPARTMENT_OTHER)
Admission: EM | Admit: 2023-04-30 | Discharge: 2023-04-30 | Disposition: A | Discharge status: Home / Self Care | Payer: Medicaid Other | Attending: Emergency Medicine | Admitting: Emergency Medicine

## 2023-04-30 DIAGNOSIS — Z7901 Long term (current) use of anticoagulants: Secondary | ICD-10-CM | POA: Insufficient documentation

## 2023-04-30 DIAGNOSIS — T83098A Other mechanical complication of other indwelling urethral catheter, initial encounter: Secondary | ICD-10-CM | POA: Insufficient documentation

## 2023-04-30 DIAGNOSIS — Y732 Prosthetic and other implants, materials and accessory gastroenterology and urology devices associated with adverse incidents: Secondary | ICD-10-CM | POA: Insufficient documentation

## 2023-04-30 DIAGNOSIS — R339 Retention of urine, unspecified: Secondary | ICD-10-CM | POA: Diagnosis present

## 2023-04-30 DIAGNOSIS — R338 Other retention of urine: Secondary | ICD-10-CM
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
## 2023-04-30 NOTE — ED Notes (Signed)
Upon brining the patient back to treatment room patient was further examined and lower abdominal distension was noted. Painful to touch, patient crying and was in obvious discomfort. Bladder scan was obtained revealing in bladder. Preston Fleeting, MD was notified and was present in room who then instructed this RN to remove the old foley catheter and insert a new foley catheter. The old catheter was empty and was noted to have cloudy spots/clumps in the bag and in the tubing. Proper procedure for removal of the foley catheter was preformed followed by the proper procedure for insertion of the foley catheter. Patient tolerated the foley insertion well, no obstruction was felt during insertion. Urine return was instantly noted. Urine was cloudy, white and appeared to have mucus. A urine sample was collected and was notified to Preston Fleeting, MD that urine sample was collected. No orders at this time. Patient began to feel relief, was calm and lower abdominal distention was no longer noted. Patient is currently resting. Even Respirations. No Distress noted at this time. Vitals Sign Stable. Family member at bedside.
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
## 2023-05-05 ENCOUNTER — Ambulatory Visit: Payer: Self-pay | Admitting: Urology

## 2023-05-06 ENCOUNTER — Ambulatory Visit: Payer: Medicaid Other | Admitting: Physical Therapy

## 2023-05-06 ENCOUNTER — Ambulatory Visit: Payer: Medicaid Other | Admitting: Urology

## 2023-05-06 ENCOUNTER — Encounter: Payer: Self-pay | Admitting: Urology

## 2023-05-06 ENCOUNTER — Telehealth: Payer: Self-pay

## 2023-05-06 VITALS — BP 116/80 | HR 101 | Ht 69.0 in | Wt 140.0 lb

## 2023-05-06 DIAGNOSIS — N319 Neuromuscular dysfunction of bladder, unspecified: Secondary | ICD-10-CM | POA: Diagnosis not present

## 2023-05-06 DIAGNOSIS — G8221 Paraplegia, complete: Secondary | ICD-10-CM

## 2023-05-06 DIAGNOSIS — M545 Low back pain, unspecified: Secondary | ICD-10-CM

## 2023-05-06 MED ORDER — CEPHALEXIN 500 MG PO CAPS: 500.0000 mg | ORAL_CAPSULE | Freq: Two times a day (BID) | ORAL | 0 refills | Status: DC
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
## 2023-05-06 NOTE — Patient Instructions (Signed)
 Neurogenic Bladder  Neurogenic bladder is a bladder control disorder. It is usually caused by problems with the nerves that control the bladder. The brain sends signals through the spinal cord to the muscles in the bladder that start and stop urine flow. With neurogenic bladder, the nerves and muscles do not work together the way they should. This condition may make the bladder overactive, meaning you have trouble holding urine. In other cases, it may make the bladder underactive. This means that you have trouble passing urine. What are the causes? This condition may be caused by nerve damage or a condition that disrupts the signals from your brain to your bladder. Many things can cause these nerve problems, including: A disease that affects the nervous system, such as: Alzheimer's disease. Cerebral palsy. Multiple sclerosis. Diabetes. Parkinson's disease. Damage to your brain or spinal cord. This can come from: Trauma. Tumors. Infection. Surgery. Alcohol abuse. Stroke. A congenital disability that affects the spinal cord. What increases the risk? You are more likely to develop this condition if you have nerve damage or a nerve disorder. What are the signs or symptoms? Signs and symptoms of this condition include: Leaking or gushing urine (incontinence). A sudden, strong urge to pass urine (urgency). Frequent urination during the day and night. Being unable to empty your bladder completely (urinary retention). Frequent urinary tract infections. How is this diagnosed? This condition may be diagnosed based on: Your symptoms and medical history. A physical exam. Records from a bladder diary. You may be asked to keep a record or log of your bladder symptoms and the times that you urinate. You may also have tests, such as: A urine test to check for infection. A bladder scan after you urinate to see how much urine is left in your bladder. Tests to measure your urine flow and see how  well the flow is controlled (urodynamic tests). A procedure that uses a small device with a camera to look through your urethra into your bladder (cystoscopy). A health care provider who specializes in the urinary tract (urologist) may do this test. Imaging tests of your brain or spine, such as MRI or CT scan. How is this treated? Treatment for this condition depends on the cause and the symptoms that you have. Work closely with your health care provider to find the treatments that will improve your quality of life. Treatment options include: Learning ways to control when you urinate, such as: Urinating at scheduled times. Training yourself to delay urination. Exercises to strengthen the muscles that control urine flow (Kegel exercises). Avoiding foods or drinks that make your symptoms worse. Taking medicines to: Stimulate an underactive bladder. Relax an overactive bladder. Treat a urinary tract infection. Learning how to use a thin tube (catheter) to empty your bladder. A catheter is a hollow tube that you pass through your urethra. Procedures to stimulate the nerves that control your bladder. Surgery, if other treatments do not help. Follow these instructions at home: Lifestyle  Keep a bladder diary to find out which foods, liquids, or activities make your symptoms worse. Use your bladder diary to schedule bathroom trips. If you are away from home, plan to be near a bathroom when your schedule says you will need one. Limit beverages that stimulate urination. These include soda, coffee, and tea. After urinating, wait a few minutes and try again. Make sure you urinate just before you leave the house and just before you go to bed. Kegel exercises Do Kegel exercises to strengthen the muscles  that control the passing of urine. These muscles are the ones you use to try to hold urine when you need to urinate. To do Kegel exercises: Squeeze your pelvic floor muscles tight, as if you are trying  to stop the flow of urine. You should feel a tight lift in your rectal area. If you are male, you should also feel a tightness in your vaginal area. Keep your stomach, buttocks, and legs relaxed. Hold the muscles tight for 5-10 seconds. Relax your muscles for the same amount of time. Repeat 10 times. Repeat this exercise 3 times a day or as many times as told by your health care provider. General instructions Take over-the-counter and prescription medicines only as told by your health care provider. Keep all follow-up visits. This is important. Contact a health care provider if: You are having a hard time controlling your symptoms. Your symptoms are getting worse. You have signs of a urinary tract infection. These may include: A burning feeling when you urinate. Fever or chills. Cloudy or bloody urine. Get help right away if: You cannot pass urine. Summary Neurogenic bladder is a bladder control disorder caused by problems with the nerves that control the bladder. This condition may make the bladder overactive or underactive. This condition may be caused by nerve damage or a condition that disrupts the signals from your brain to your bladder. Treatment depends on the cause of your neurogenic bladder and the symptoms that you have. Work closely with your health care provider to find the treatments that will improve your quality of life. This information is not intended to replace advice given to you by your health care provider. Make sure you discuss any questions you have with your health care provider. Document Revised: 05/23/2020 Document Reviewed: 05/23/2020 Elsevier Patient Education  2024 [employer].
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
## 2023-05-07 ENCOUNTER — Ambulatory Visit: Payer: Medicaid Other | Admitting: Physical Therapy

## 2023-05-07 ENCOUNTER — Encounter: Payer: Self-pay | Admitting: Physical Therapy

## 2023-05-07 VITALS — BP 93/67 | HR 128

## 2023-05-07 DIAGNOSIS — M6281 Muscle weakness (generalized): Secondary | ICD-10-CM

## 2023-05-07 DIAGNOSIS — G8221 Paraplegia, complete: Secondary | ICD-10-CM

## 2023-05-07 DIAGNOSIS — S24104A Unspecified injury at T11-T12 level of thoracic spinal cord, initial encounter: Secondary | ICD-10-CM
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
## 2023-05-11 NOTE — Telephone Encounter (Signed)
Patients aunt/legal guardian Lee [name] has called to check the status of this referral, per [facility] she has not heard anything back, please advise, document status of referral and contact [name] back @ # [phone]

## 2023-05-11 NOTE — Telephone Encounter (Signed)
Pt is wanting referral to pain management.

## 2023-05-12 ENCOUNTER — Other Ambulatory Visit: Payer: Self-pay

## 2023-05-12 ENCOUNTER — Telehealth: Payer: Self-pay

## 2023-05-12 ENCOUNTER — Emergency Department
Admission: EM | Admit: 2023-05-12 | Discharge: 2023-05-12 | Disposition: A | Discharge status: Home / Self Care | Payer: Medicaid Other | Attending: Emergency Medicine | Admitting: Emergency Medicine

## 2023-05-12 DIAGNOSIS — N3090 Cystitis, unspecified without hematuria: Secondary | ICD-10-CM | POA: Insufficient documentation

## 2023-05-12 DIAGNOSIS — T83091A Other mechanical complication of indwelling urethral catheter, initial encounter: Secondary | ICD-10-CM | POA: Insufficient documentation

## 2023-05-12 DIAGNOSIS — R339 Retention of urine, unspecified: Secondary | ICD-10-CM

## 2023-05-12 DIAGNOSIS — Y838 Other surgical procedures as the cause of abnormal reaction of the patient, or of later complication, without mention of misadventure at the time of the procedure: Secondary | ICD-10-CM | POA: Diagnosis not present

## 2023-05-12 LAB — URINALYSIS, ROUTINE W REFLEX MICROSCOPIC
Bacteria, UA: NONE SEEN
Bilirubin Urine: NEGATIVE
Glucose, UA: NEGATIVE mg/dL
Ketones, ur: NEGATIVE mg/dL
Nitrite: NEGATIVE
Protein, ur: 100 mg/dL — AB
RBC / HPF: >50 RBC/hpf (ref 0–5)
Specific Gravity, Urine: 1.013 (ref 1.005–1.030)
Squamous Epithelial / HPF: NONE SEEN /HPF (ref 0–5)
WBC, UA: >50 WBC/hpf (ref 0–5)
pH: 7 (ref 5.0–8.0)

## 2023-05-12 MED ORDER — LIDOCAINE HCL URETHRAL/MUCOSAL 2 % EX GEL
1.0000 | Freq: Once | CUTANEOUS | Status: AC
  Administered 2023-05-12: 1 via URETHRAL
  Filled 2023-05-12: qty 6

## 2023-05-12 MED ORDER — CEPHALEXIN 500 MG PO CAPS: 500.0000 mg | ORAL_CAPSULE | Freq: Four times a day (QID) | ORAL | 0 refills | Status: AC
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
## 2023-05-12 NOTE — Addendum Note (Signed)
Addended by: Jonah Blue B on: 05/12/2023 02:22 PM   Modules accepted: Orders

## 2023-05-12 NOTE — Telephone Encounter (Signed)
Please call PMR, Dr. Lorna Few office, and request that patient's follow-up appointment be moved up.  She saw him while he was in the hospital and he has a follow-up appointment with her in October 11th.  However patient needs continued pain management and his legal guardian is calling regarding this. Once completed, please call his aunt to let her know.

## 2023-05-12 NOTE — ED Triage Notes (Signed)
Pt sts that he has a foley cath and it is not draining. Pt sts that he is having pressure in his lower abd.

## 2023-05-12 NOTE — Telephone Encounter (Signed)
Duplicate call documentation on previous encounter.

## 2023-05-12 NOTE — Discharge Instructions (Addendum)
Please take the antibiotic as prescribed.  Please follow-up with urology as soon as possible.  Please return for any new, worsening, or change in symptoms or other concerns.  It was a pleasure caring for you today.

## 2023-05-12 NOTE — Telephone Encounter (Signed)
Copied from CRM [id number]. Topic: General - Other >> May 12, 2023 12:48 PM Turkey B wrote: Reason for CRM: pt's aunt called in checking pain management referral. I let her know this is being workied on to have his appt moved up from October. Please cb when this is completed

## 2023-05-12 NOTE — Telephone Encounter (Signed)
Call placed to Dr.Lovorn's office to get earlier appointment, no appointments available pt has been placed on wait list.  Call placed to aunt to inform her of the update and she states that Dr.Lovorn will no longer prescribed him any pain medication due to falling drug test. Aunt is asking for new referral to pain management.

## 2023-05-12 NOTE — Telephone Encounter (Signed)
Referral to pain clinic submitted

## 2023-05-13 ENCOUNTER — Ambulatory Visit: Payer: Medicaid Other | Admitting: Physical Therapy

## 2023-05-13 ENCOUNTER — Encounter: Payer: Self-pay | Admitting: Physical Therapy

## 2023-05-13 VITALS — BP 126/85 | HR 118

## 2023-05-13 DIAGNOSIS — G8221 Paraplegia, complete: Secondary | ICD-10-CM

## 2023-05-13 DIAGNOSIS — S24104A Unspecified injury at T11-T12 level of thoracic spinal cord, initial encounter: Secondary | ICD-10-CM

## 2023-05-13 DIAGNOSIS — M6281 Muscle weakness (generalized): Secondary | ICD-10-CM
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
## 2023-05-14 ENCOUNTER — Telehealth: Payer: Self-pay | Admitting: Family Medicine

## 2023-05-14 NOTE — Telephone Encounter (Signed)
Informed patient aunt that PCS forms was faxed by CM on 04/28/2023 at 0928 per confirmation page.   Refaxed forms to same number as CM today in addition to the previous confirmation page to [phone].   Aunt was appreciative and said she will follow up with [facility].

## 2023-05-14 NOTE — Telephone Encounter (Signed)
Aunt [name] following up on request of the form ([employer] form) she left at the office with Dr Alvis Lemmings. It is for pt to get home health care. Please follow up . Thank you

## 2023-05-15 LAB — URINE CULTURE: Culture: >=100000 — AB

## 2023-05-19 ENCOUNTER — Telehealth: Payer: Self-pay | Admitting: Family Medicine

## 2023-05-19 ENCOUNTER — Telehealth: Payer: Self-pay

## 2023-05-19 NOTE — Telephone Encounter (Signed)
Patient mom called stating that patient was prescribed Cymbalta when he left the hospital but he is allergic to Cymbalta. Is there something else to be given?

## 2023-05-19 NOTE — Telephone Encounter (Signed)
Aunt [name] calling to ask why was pt referred to a pain management dr that does not give pain medication.  Only give steroid injections. Pt needs pain medication. She says she has been calling and trying for 3 weeks to get pt pain meds.  She said she gave Dr Eather Colas name as a referral pain management, and she said he excepts his medicaid and excepts new pts.  She does not mind coming to [place].Marland Kitchen

## 2023-05-19 NOTE — Telephone Encounter (Signed)
Copied from CRM [id number]. Topic: General - Other >> May 19, 2023 10:13 AM Turkey B wrote: Reason for CRM: Olegario Messier from [facility] pain center, called in says needs recent notes on patient for pain referral. Fx is [phone]

## 2023-05-19 NOTE — Telephone Encounter (Signed)
Can we change pain referral to the one listed below.

## 2023-05-20 ENCOUNTER — Encounter: Payer: Self-pay | Admitting: Physical Therapy

## 2023-05-20 ENCOUNTER — Telehealth: Payer: Self-pay

## 2023-05-20 ENCOUNTER — Ambulatory Visit: Payer: Medicaid Other | Admitting: Physical Therapy

## 2023-05-20 DIAGNOSIS — G8221 Paraplegia, complete: Secondary | ICD-10-CM | POA: Diagnosis not present

## 2023-05-20 DIAGNOSIS — M6281 Muscle weakness (generalized): Secondary | ICD-10-CM

## 2023-05-20 DIAGNOSIS — S24104A Unspecified injury at T11-T12 level of thoracic spinal cord, initial encounter: Secondary | ICD-10-CM

## 2023-05-20 MED ORDER — CITALOPRAM HYDROBROMIDE 10 MG PO TABS: 10.0000 mg | ORAL_TABLET | Freq: Every day | ORAL | 5 refills | Status: DC

## 2023-05-20 NOTE — Telephone Encounter (Signed)
Pt's aunt called in again , checking status of referral, says needs asap for pain management. I let her know this is still being worked on.

## 2023-05-20 NOTE — Telephone Encounter (Signed)
Duplicate message. 

## 2023-05-20 NOTE — Telephone Encounter (Signed)
Aunt was called and informed that form has been faxed , fax number was given to patient to confirm.
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
## 2023-05-20 NOTE — Telephone Encounter (Signed)
Copied from CRM [id number]. Topic: General - Other >> May 20, 2023  2:42 PM Turkey B wrote: Reason for CRM: pt's aunt called in about status of [facility], pca form being filled out an faxed to [facility]. She says she was told they never received this, Can you please cb and verify the fax # this was sent to?

## 2023-05-20 NOTE — Telephone Encounter (Signed)
Pt getting angry easily and frustrated- depressed- asked ot start something for depression Will start Celexa/Citalopram 10 mg daily x 2 weeks- if not enough, can increase to 20 mg daily- for depression/mood D/w aunt

## 2023-05-21 ENCOUNTER — Telehealth: Payer: Self-pay | Admitting: *Deleted

## 2023-05-21 NOTE — Telephone Encounter (Signed)
Citalopram 10 mg not covered by patient's insurance Preferred drug: Citalopram HBR, Paroxetine HCL Fluoxetine HCl Sertraline, Paxil

## 2023-05-21 NOTE — Telephone Encounter (Signed)
Called pharmacy and Citalopram is ready for pick up, disregard previous message.

## 2023-05-25 ENCOUNTER — Telehealth: Payer: Self-pay | Admitting: Family Medicine

## 2023-05-25 NOTE — Telephone Encounter (Signed)
Patients aunt Glennon Hamilton called stated the [facility] PCA forms need to be faxed over to [phone]. The fax number given previously was the wrong fax#

## 2023-05-26 NOTE — Telephone Encounter (Signed)
I returned the call to [facility] and the recording stated that the subscriber is not accepting calls at this time   I need to inform her that the referral was faxed to [facility] which is his insurance.  It should not be faxed to the number she provided : [phone] because that number is for standard Medicaid [employer] referrals and he does not have standard Medicaid he has [employer] Medicaid.

## 2023-05-26 NOTE — Telephone Encounter (Signed)
FYI

## 2023-05-27 ENCOUNTER — Encounter: Payer: Self-pay | Admitting: Physical Therapy

## 2023-05-27 ENCOUNTER — Ambulatory Visit: Payer: Medicaid Other | Attending: Physician Assistant | Admitting: Physical Therapy

## 2023-05-27 VITALS — BP 131/74 | HR 100

## 2023-05-27 DIAGNOSIS — M6281 Muscle weakness (generalized): Secondary | ICD-10-CM | POA: Diagnosis present

## 2023-05-27 DIAGNOSIS — G8221 Paraplegia, complete: Secondary | ICD-10-CM | POA: Diagnosis present

## 2023-05-27 DIAGNOSIS — S24104A Unspecified injury at T11-T12 level of thoracic spinal cord, initial encounter: Secondary | ICD-10-CM
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
## 2023-05-28 ENCOUNTER — Encounter: Payer: Self-pay | Admitting: Physical Therapy

## 2023-05-31 ENCOUNTER — Ambulatory Visit (INDEPENDENT_AMBULATORY_CARE_PROVIDER_SITE_OTHER): Payer: Medicaid Other | Admitting: Urology

## 2023-05-31 ENCOUNTER — Encounter: Payer: Self-pay | Admitting: Urology

## 2023-05-31 ENCOUNTER — Ambulatory Visit: Payer: Medicaid Other | Admitting: Urology

## 2023-05-31 VITALS — BP 111/82 | HR 88 | Ht 68.0 in | Wt 140.0 lb

## 2023-05-31 DIAGNOSIS — N319 Neuromuscular dysfunction of bladder, unspecified: Secondary | ICD-10-CM

## 2023-05-31 LAB — BLADDER SCAN AMB NON-IMAGING

## 2023-05-31 MED ORDER — SULFAMETHOXAZOLE-TRIMETHOPRIM 800-160 MG PO TABS
1.0000 | ORAL_TABLET | Freq: Two times a day (BID) | ORAL | 0 refills | Status: AC
Start: 2023-05-31 — End: 2023-06-03

## 2023-05-31 NOTE — Progress Notes (Signed)
Simple Catheter Placement  Due to urinary retention patient is present today for a foley cath placement.  Patient was cleaned and prepped in a sterile fashion with betadine and 2% lidocaine jelly was instilled into the urethra. A 16 FR coude foley catheter was inserted, urine return was noted  >216ml, urine was pink and milky in color.  The balloon was filled with 10cc of sterile water.  A night bag was attached for drainage.  Patient tolerated well, no complications were noted   Performed by: Carman Ching, PA-C  Additional notes: Bactrim DS BID x3 days given milky urine with Foley replacement.

## 2023-05-31 NOTE — Addendum Note (Signed)
Addended by: Debarah Crape on: 05/31/2023 04:54 PM   Modules accepted: Orders
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
## 2023-05-31 NOTE — Progress Notes (Signed)
Catheter Removal  Patient is present today for a catheter removal. 9ml of water was drained from the balloon. A 16FR Coude foley cath was removed from the bladder, no complications were noted. Patient tolerated well.  Performed by: Lucendia Herrlich  Follow up/ Additional notes: RTC for afternoon PVR

## 2023-06-01 ENCOUNTER — Emergency Department
Admission: EM | Admit: 2023-06-01 | Discharge: 2023-06-01 | Disposition: A | Discharge status: Home / Self Care | Payer: Medicaid Other | Attending: Emergency Medicine | Admitting: Emergency Medicine

## 2023-06-01 ENCOUNTER — Other Ambulatory Visit: Payer: Self-pay

## 2023-06-01 DIAGNOSIS — G8929 Other chronic pain: Secondary | ICD-10-CM | POA: Insufficient documentation

## 2023-06-01 DIAGNOSIS — M545 Low back pain, unspecified: Secondary | ICD-10-CM | POA: Diagnosis present

## 2023-06-01 DIAGNOSIS — L89152 Pressure ulcer of sacral region, stage 2: Secondary | ICD-10-CM

## 2023-06-01 HISTORY — DX: Accidental discharge from unspecified firearms or gun, initial encounter: W34.00XA

## 2023-06-01 MED ORDER — OXYCODONE HCL 5 MG PO TABS: 5.0000 mg | ORAL_TABLET | Freq: Four times a day (QID) | ORAL | 0 refills | Status: AC | PRN

## 2023-06-01 MED ORDER — FOSFOMYCIN TROMETHAMINE 3 G PO PACK
3.0000 g | PACK | Freq: Once | ORAL | Status: AC
  Administered 2023-06-01: 3 g via ORAL
  Filled 2023-06-01: qty 3

## 2023-06-01 MED ORDER — FOSFOMYCIN TROMETHAMINE 3 G PO PACK: 3.0000 g | PACK | ORAL | 0 refills | Status: AC

## 2023-06-01 MED ORDER — OXYCODONE HCL 5 MG PO TABS
10.0000 mg | ORAL_TABLET | Freq: Once | ORAL | Status: AC
  Administered 2023-06-01: 10 mg via ORAL
  Filled 2023-06-01: qty 2

## 2023-06-01 MED ORDER — ONDANSETRON 4 MG PO TBDP
4.0000 mg | ORAL_TABLET | Freq: Once | ORAL | Status: AC
  Administered 2023-06-01: 4 mg via ORAL
  Filled 2023-06-01: qty 1

## 2023-06-01 NOTE — ED Notes (Signed)
Mepilex applied to sacrum

## 2023-06-01 NOTE — Discharge Instructions (Addendum)
For your UTI:  Continue your current regiment Add the Fosfomycin, which you will take on 9/13 and 9/16 for 2 additional doses. Drink plenty of fluids  For your wound  Try to keep the pressure ulcer bandage on it at all times Change every 2-3 days unless soiled  Call PCP as we discussed
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
## 2023-06-03 ENCOUNTER — Encounter: Payer: Self-pay | Admitting: Physical Therapy

## 2023-06-03 ENCOUNTER — Ambulatory Visit: Payer: Medicaid Other | Admitting: Physical Therapy

## 2023-06-03 VITALS — BP 100/67 | HR 109

## 2023-06-03 DIAGNOSIS — M6281 Muscle weakness (generalized): Secondary | ICD-10-CM

## 2023-06-03 DIAGNOSIS — S24104A Unspecified injury at T11-T12 level of thoracic spinal cord, initial encounter: Secondary | ICD-10-CM

## 2023-06-03 DIAGNOSIS — G8221 Paraplegia, complete: Secondary | ICD-10-CM | POA: Diagnosis not present

## 2023-06-03 NOTE — Telephone Encounter (Signed)
Message received that Lee [name] was returning my call.  I called her back at the number provided by [facility]: [phone] and the recording continues to state that the subscriber is not accepting calls at this time
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
## 2023-06-03 NOTE — Telephone Encounter (Signed)
Call placed to patient's aunt, Lee [name] at [phone], message left with call back requested.

## 2023-06-03 NOTE — Telephone Encounter (Signed)
I spoke to patient's aunt, Glennon Hamilton and updated her phone number in Epic.  She explained that a referral was made for pain management weeks ago and the patient has still not been able to get in to see a pain management provider. The referral had been sent to [facility] and she was told that they only provide steroid therapy and that is not what he needs.  A referral then went to [facility] and she was told that they rejected the referral.  She would like him seen as soon as possible and is willing to go to [place] or [place].   She then requested that a referral be sent to [facility]. She said that she contacted [facility] and they are in network with [facility] and are accepting new patients. Arna Medici- can you please send the referral there and then contact [name] when it has been sent?  Her number is in Epic.  Thanks  Regarding PCS, I informed her that the referral was sent to [employer] as he does not have regular Medicaid , and I provided her with the phone number for [facility] LTSS services: [phone] and she can call to check on the status of the referral.  Glennon Hamilton also said the he was seen in the ED recently and was told that he has the beginning of a pressure ulcer.  She said they gave her some supplies when he left the hospital and would like more ordered. I asked that she call back with the specific name of the supplies and we can place an order with a DME supplier but there is no guarantee that the supplies will be covered by his insurance.

## 2023-06-04 ENCOUNTER — Encounter: Payer: Self-pay | Admitting: Physical Therapy

## 2023-06-04 ENCOUNTER — Telehealth: Payer: Self-pay | Admitting: Physical Medicine and Rehabilitation

## 2023-06-04 NOTE — Telephone Encounter (Signed)
His aunt called and said physical therapy told them they were discharging him and that Dr Berline Chough agreed with them. She wants Dr Berline Chough to call her and explain why he's being discharged

## 2023-06-04 NOTE — Therapy (Signed)
[facility] [address] [place], [place], [address] Phone: [phone]   Fax:  [phone]  Patient Details  Name: Lee [name] MRN: [id number] Date of Birth: [birth year] Referring Provider:  Charlton Amor, PA-C  Encounter Date: 06/04/2023  Patient aunt called and made aware of reason why discharging from PT at this time. Explained need for bowel program to be well managed as well time to allow new sacral wound to heal.   PHYSICAL THERAPY DISCHARGE SUMMARY  Visits from Start of Care: 7  Current functional level related to goals / functional outcomes: Unable to assess as patient to work on medical management before following up with PT   Remaining deficits: Paraplegia with use of manual wheelchair, accidents with bowel/bladder program, difficulty directing care   Education / Equipment: Follow up about bowel/bladder program, recommend time to allow for wound healing  Patient agrees to discharge. Patient goals were not met. Patient is being discharged due to  need to be more medically manage in order to maximize limited PT visits.  Carmelia Bake, PT, DPT 06/04/2023, 9:20 AM  [facility] [address] [place], [place], [address] Phone: [phone]   Fax:  [phone]

## 2023-06-07 NOTE — Telephone Encounter (Signed)
I left a detailed message on her voicemail as to why patient has been discharged from therapy- if she has any other specific questions, let me know- I attempted to reach her last week 3 times. Thanks- ML

## 2023-06-07 NOTE — Telephone Encounter (Signed)
Aunt is returning your call.

## 2023-06-07 NOTE — Telephone Encounter (Signed)
I spoke to [facility] and informed her that the referral for pain management was sent to [facility].  She said she is aware of that and will probably call [facility] to schedule that appointment.   She also wanted to let me know that the hospital gave them  Mepilex Border Sacrum Dressing and she would like an order to be placed for this.  I told her that if Dr  Alvis Lemmings is in agreement, we can send the order to [facility] and they will verify  insurance coverage.  I also told her that there is no guarantee that the insurance will cover this and she said she understood.

## 2023-06-07 NOTE — Telephone Encounter (Signed)
Call placed to [facility] to inform her that the referral for pain management has been sent to [facility] and they will call her with an appointment.  Message left with call back requested

## 2023-06-07 NOTE — Telephone Encounter (Signed)
Patient's aunt Glennon Hamilton has called back, returning Robyne Peers, RN, call. Advised of information below. Please call [name] back @ # [phone]

## 2023-06-08 MED ORDER — MISC. DEVICES MISC: 11 refills | Status: AC

## 2023-06-08 NOTE — Telephone Encounter (Signed)
Done

## 2023-06-09 ENCOUNTER — Telehealth: Payer: Self-pay

## 2023-06-09 NOTE — Telephone Encounter (Signed)
Per pharmacy Citalopram 10mg  tablets not covered by patient plan alternative medications covered are citalopram hbr, paroxetine HCL, fluvoxamine maleate, zoloft, paxil

## 2023-06-09 NOTE — Telephone Encounter (Signed)
Orders for sacrum dressing faxed to [facility]

## 2023-06-10 ENCOUNTER — Ambulatory Visit: Payer: Medicaid Other | Admitting: Physical Therapy

## 2023-06-10 MED ORDER — CITALOPRAM HYDROBROMIDE 20 MG PO TABS: 20.0000 mg | ORAL_TABLET | Freq: Every day | ORAL | 5 refills | Status: DC

## 2023-06-11 ENCOUNTER — Emergency Department (HOSPITAL_BASED_OUTPATIENT_CLINIC_OR_DEPARTMENT_OTHER)
Admission: EM | Admit: 2023-06-11 | Discharge: 2023-06-11 | Disposition: A | Discharge status: Home / Self Care | Payer: Medicaid Other | Attending: Emergency Medicine | Admitting: Emergency Medicine

## 2023-06-11 ENCOUNTER — Encounter (HOSPITAL_BASED_OUTPATIENT_CLINIC_OR_DEPARTMENT_OTHER): Payer: Self-pay | Admitting: Emergency Medicine

## 2023-06-11 ENCOUNTER — Other Ambulatory Visit: Payer: Self-pay

## 2023-06-11 DIAGNOSIS — R339 Retention of urine, unspecified: Secondary | ICD-10-CM | POA: Insufficient documentation

## 2023-06-11 DIAGNOSIS — Z7901 Long term (current) use of anticoagulants: Secondary | ICD-10-CM | POA: Diagnosis not present

## 2023-06-11 DIAGNOSIS — R1030 Lower abdominal pain, unspecified: Secondary | ICD-10-CM | POA: Insufficient documentation

## 2023-06-11 LAB — URINALYSIS, ROUTINE W REFLEX MICROSCOPIC
Bacteria, UA: NONE SEEN
Bilirubin Urine: NEGATIVE
Glucose, UA: NEGATIVE mg/dL
Ketones, ur: NEGATIVE mg/dL
Nitrite: POSITIVE — AB
Protein, ur: 30 mg/dL — AB
RBC / HPF: >50 RBC/hpf (ref 0–5)
Specific Gravity, Urine: 1.014 (ref 1.005–1.030)
WBC, UA: >50 WBC/hpf (ref 0–5)
pH: 7 (ref 5.0–8.0)

## 2023-06-11 MED ORDER — LORAZEPAM 1 MG PO TABS
1.0000 mg | ORAL_TABLET | Freq: Once | ORAL | Status: AC
  Administered 2023-06-11: 1 mg via ORAL
  Filled 2023-06-11: qty 1

## 2023-06-11 MED ORDER — LIDOCAINE HCL URETHRAL/MUCOSAL 2 % EX GEL
1.0000 | Freq: Once | CUTANEOUS | Status: AC
  Administered 2023-06-11: 1 via URETHRAL
  Filled 2023-06-11: qty 11

## 2023-06-11 NOTE — ED Notes (Signed)
Patient with small coccyx wound about size of a quarter,  Cleansed with saline then dressed with miplex dressing

## 2023-06-11 NOTE — ED Provider Notes (Signed)
[place] EMERGENCY DEPARTMENT AT [facility] Provider Note   CSN: [id number] Arrival date & time: 06/11/23  1720     History {Add pertinent medical, surgical, social history, OB history to HPI:1} Chief Complaint  Patient presents with   Urinary Retention    Lee [name] is a 17 y.o. male.  17 year old male with a history of T12 paraplegia from [facility] in June with neurogenic bladder with a chronic indwelling Foley catheter who presents emergency department with urinary retention.  Around noon today patient's family emptied his Foley catheter bag and then they noticed has not had any urinary output since then.  Since then has had lower abdominal discomfort.  During the triage process had a bladder scan that showed over 500 mL of urine.  No fevers.  Is receiving antibiotics now for possible UTI.         Home Medications Prior to Admission medications   Medication Sig Start Date End Date Taking? Authorizing Provider  apixaban (ELIQUIS) 2.5 MG TABS tablet Take 1 tablet (2.5 mg total) by mouth 2 (two) times daily. 04/21/23   Lovorn, Aundra Millet, MD  baclofen (LIORESAL) 10 MG tablet Take 0.5 tablets (5 mg total) by mouth 3 (three) times daily. X 2 weeks- if not enough, increase to 10 mg TID- for spasticity 04/21/23   Lovorn, Aundra Millet, MD  bisacodyl (DULCOLAX) 10 MG suppository Place 1 suppository (10 mg total) rectally daily. 04/06/23   Angiulli, Mcarthur Rossetti, PA-C  citalopram (CELEXA) 20 MG tablet Take 1 tablet (20 mg total) by mouth daily. 06/10/23   Lovorn, Aundra Millet, MD  docusate sodium (COLACE) 100 MG capsule Take 1 capsule (100 mg total) by mouth daily. 04/06/23   Angiulli, Mcarthur Rossetti, PA-C  gabapentin (NEURONTIN) 400 MG capsule Take 2 capsules (800 mg total) by mouth 3 (three) times daily. 04/21/23   Lovorn, Aundra Millet, MD  lidocaine 4 % Place 3 patches onto the skin daily. Remove & Discard patch within 12 hours or as directed by MD 04/07/23   Angiulli, Mcarthur Rossetti, PA-C  methocarbamol (ROBAXIN) 500 MG tablet  Take 1 tablet (500 mg total) by mouth every 6 (six) hours as needed for muscle spasms. 04/07/23   Angiulli, Mcarthur Rossetti, PA-C  Misc. Devices MISC Mepilex Border Sacrum Dressing. Diagnosis -sacral wound. 06/08/23   Hoy Register, MD  morphine (MS CONTIN) 15 MG 12 hr tablet Take 1 tablet (15 mg total) by mouth every 12 (twelve) hours. 04/21/23   Lovorn, Aundra Millet, MD  oxyCODONE (ROXICODONE) 5 MG immediate release tablet Take 1-2 tablets (5-10 mg total) by mouth every 6 (six) hours as needed for moderate pain or severe pain (no more than 6 tabs daily). 06/01/23 05/31/24  Shaune Pollack, MD  promethazine (PHENERGAN) 12.5 MG tablet Take 1 tablet (12.5 mg total) by mouth every 6 (six) hours as needed for nausea or vomiting. 04/12/23   Lovorn, Aundra Millet, MD  psyllium (HYDROCIL/METAMUCIL) 95 % PACK Take 1 packet by mouth daily. 04/06/23   Angiulli, Mcarthur Rossetti, PA-C  senna (SENOKOT) 8.6 MG TABS tablet Take 3 tablets (25.8 mg total) by mouth daily. 04/06/23   Angiulli, Mcarthur Rossetti, PA-C  topiramate (TOPAMAX) 25 MG tablet Take 1 tablet (25 mg total) by mouth at bedtime. 04/21/23   Lovorn, Aundra Millet, MD      Allergies    Cymbalta [duloxetine hcl]    Review of Systems   Review of Systems  Physical Exam Updated Vital Signs BP 113/73 (BP Location: Left Arm)   Pulse 100   Temp 98.6  F (37 C)   Resp 19   SpO2 96%  Physical Exam Vitals and nursing note reviewed.  Constitutional:      General: He is not in acute distress.    Appearance: He is well-developed.  Pulmonary:     Effort: Pulmonary effort is normal. No respiratory distress.  Abdominal:     General: There is no distension.     Palpations: Abdomen is soft. There is mass (Suprapubic).     Tenderness: There is abdominal tenderness (Suprapubic). There is no guarding.  Skin:    General: Skin is warm and dry.  Neurological:     Mental Status: He is alert. Mental status is at baseline.  Psychiatric:        Mood and Affect: Mood normal.        Behavior: Behavior  normal.     ED Results / Procedures / Treatments   Labs (all labs ordered are listed, but only abnormal results are displayed) Labs Reviewed  URINALYSIS, ROUTINE W REFLEX MICROSCOPIC    EKG None  Radiology No results found.  Procedures Procedures  {Document cardiac monitor, telemetry assessment procedure when appropriate:1}  Medications Ordered in ED Medications  lidocaine (XYLOCAINE) 2 % jelly 1 Application (has no administration in time range)  LORazepam (ATIVAN) tablet 1 mg (has no administration in time range)    ED Course/ Medical Decision Making/ A&P   {   Click here for ABCD2, HEART and other calculatorsREFRESH Note before signing :1}                              Medical Decision Making Amount and/or Complexity of Data Reviewed Labs: ordered.  Risk Prescription drug management.   ***  {Document critical care time when appropriate:1} {Document review of labs and clinical decision tools ie heart score, Chads2Vasc2 etc:1}  {Document your independent review of radiology images, and any outside records:1} {Document your discussion with family members, caretakers, and with consultants:1} {Document social determinants of health affecting pt's care:1} {Document your decision making why or why not admission, treatments were needed:1} Final Clinical Impression(s) / ED Diagnoses Final diagnoses:  None    Rx / DC Orders ED Discharge Orders     None

## 2023-06-11 NOTE — Discharge Instructions (Signed)
You were seen for your urinary retention in the emergency department.   At home, please continue your medications.    Check your MyChart online for the results of any tests that had not resulted by the time you left the emergency department. You will be called about your urine culture and if you need to be started on antibiotics.   Follow-up with your primary doctor in 2-3 days regarding your visit.    Return immediately to the emergency department if you experience any of the following: fever, worsening abdominal pain, or any other concerning symptoms.    Thank you for visiting our Emergency Department. It was a pleasure taking care of you today.
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
## 2023-06-13 ENCOUNTER — Telehealth (HOSPITAL_BASED_OUTPATIENT_CLINIC_OR_DEPARTMENT_OTHER): Payer: Self-pay | Admitting: Emergency Medicine

## 2023-06-13 LAB — URINE CULTURE: Culture: 80000 — AB

## 2023-06-13 NOTE — Telephone Encounter (Signed)
Attempted to call patient about urine culture results. Is growing pseudomonas that is pan sensitive but with 80k cfus. Since he is less than 100k cfus and his symptoms on presentation to the ED were likely due to his obstructed catheter do not feel that he needs additional antibiotics at this time.

## 2023-06-14 ENCOUNTER — Telehealth (HOSPITAL_BASED_OUTPATIENT_CLINIC_OR_DEPARTMENT_OTHER): Payer: Self-pay | Admitting: *Deleted

## 2023-06-14 NOTE — Progress Notes (Signed)
ED Antimicrobial Stewardship Positive Culture Follow Up   Lee [name] is an 17 y.o. male who presented to [facility] on 06/11/2023 with a chief complaint of  Chief Complaint  Patient presents with   Urinary Retention    Recent Results (from the past 720 hour(s))  Urine Culture     Status: Abnormal   Collection Time: 06/11/23  6:48 PM   Specimen: Urine, Catheterized  Result Value Ref Range Status   Specimen Description   Final    URINE, CATHETERIZED Performed at [facility], [address] [place], [place] [address]    Special Requests   Final    NONE Performed at [facility], [address], [place], [place] [address]    Culture 80,000 COLONIES/mL PSEUDOMONAS AERUGINOSA (A)  Final   Report Status 06/13/2023 FINAL  Final   Organism ID, Bacteria PSEUDOMONAS AERUGINOSA (A)  Final      Susceptibility   Pseudomonas aeruginosa - MIC*    CEFTAZIDIME 4 SENSITIVE Sensitive     CIPROFLOXACIN <=0.25 SENSITIVE Sensitive     GENTAMICIN <=1 SENSITIVE Sensitive     IMIPENEM 2 SENSITIVE Sensitive     PIP/TAZO <=4 SENSITIVE Sensitive     CEFEPIME 2 SENSITIVE Sensitive     * 80,000 COLONIES/mL PSEUDOMONAS AERUGINOSA    [x]  Patient discharged originally without antimicrobial agent and treatment is now indicated  If patient is having urinary symptoms, please call in new prescription cipro 250mg  q12h x 3 days  ED Provider: Arlester Marker, PA    Marja Kays 06/14/2023, 8:11 AM Clinical Pharmacist Monday - Friday phone -  [phone] Saturday - Sunday phone - [phone]

## 2023-06-14 NOTE — Telephone Encounter (Addendum)
Post ED Visit - Positive Culture Follow-up: Successful Patient Follow-Up  Culture assessed and recommendations reviewed by:  []  Enzo Bi, Pharm.D. []  Celedonio Miyamoto, Pharm.D., BCPS AQ-ID []  Garvin Fila, Pharm.D., BCPS []  Georgina Pillion, Pharm.D., BCPS []  [place], [address].D., BCPS, AAHIVP []  Estella Husk, Pharm.D., BCPS, AAHIVP []  Lysle Pearl, PharmD, BCPS []  Phillips Climes, PharmD, BCPS []  Agapito Games, PharmD, BCPS [x]  Ivery Quale,, PharmD  Positive urine culture  [x]  Patient discharged without antimicrobial prescription and treatment is now indicated []  Organism is resistant to prescribed ED discharge antimicrobial []  Patient with positive blood cultures  Pt still having some urinary symptoms and pain in bladder area.   Changes discussed with ED provider: Arlester Marker, PA New antibiotic prescription Cipro 250mg  q12hrs x 3 days Called to [facility] [address] [place], [place]  Contacted patient, date 06/14/23, time 1308   Patsey Berthold 06/14/2023, 9:43 AM

## 2023-06-17 ENCOUNTER — Ambulatory Visit: Payer: Medicaid Other | Admitting: Physical Therapy

## 2023-06-24 ENCOUNTER — Ambulatory Visit: Payer: Self-pay | Admitting: Physician Assistant

## 2023-06-24 NOTE — Progress Notes (Deleted)
Patient ID: Lee [name], male   DOB: [birth year], 17 y.o.   MRN: [id number]   Seen at ED for urinary retention 06/11/2023  MDM/Course:  Patient presents emergency department with decreased output of his Foley catheter.  There is it has been in for several weeks.  No hematuria that would be causing outlet obstruction.  Was bladder scanned and was retaining significant fluid.  Had his Foley exchanged in the emergency department without difficulty.  Urinalysis was sent which does show nitrite and leukocyte Estrace.  Family reports that he is currently on antibiotics but they are unsure which one.  Did send off a urine culture from the new Foley and we will call them with the results to ensure that he is on the correct antibiotics.  Based on prior culture results should be able to use ciprofloxacin if his urine culture is positive today.  Upon re-evaluation was feeling much better and was requesting to go home.   This patient presents to the ED for concern of complaints listed in HPI, this involves an extensive number of treatment options, and is a complaint that carries with it a high risk of complications and morbidity. Disposition including potential need for admission considered.    Dispo: DC Home. Return precautions discussed including, but not limited to, those listed in the AVS. Allowed pt time to ask questions which were answered fully prior to dc.

## 2023-06-25 ENCOUNTER — Encounter: Payer: Self-pay | Admitting: Nurse Practitioner

## 2023-06-25 ENCOUNTER — Ambulatory Visit: Payer: Self-pay | Admitting: Nurse Practitioner

## 2023-06-25 ENCOUNTER — Ambulatory Visit (INDEPENDENT_AMBULATORY_CARE_PROVIDER_SITE_OTHER): Payer: Medicaid Other | Admitting: Nurse Practitioner

## 2023-06-25 VITALS — BP 96/51 | HR 110 | Resp 16

## 2023-06-25 DIAGNOSIS — Z993 Dependence on wheelchair: Secondary | ICD-10-CM | POA: Diagnosis not present

## 2023-06-25 DIAGNOSIS — G894 Chronic pain syndrome: Secondary | ICD-10-CM | POA: Diagnosis not present

## 2023-06-25 DIAGNOSIS — G8221 Paraplegia, complete: Secondary | ICD-10-CM

## 2023-06-25 MED ORDER — BACLOFEN 10 MG PO TABS: 5.0000 mg | ORAL_TABLET | Freq: Three times a day (TID) | ORAL | 5 refills | Status: DC

## 2023-06-25 MED ORDER — GABAPENTIN 400 MG PO CAPS
800.0000 mg | ORAL_CAPSULE | Freq: Three times a day (TID) | ORAL | 5 refills | Status: DC
Start: 2023-06-25 — End: 2023-10-11

## 2023-06-25 MED ORDER — METHOCARBAMOL 500 MG PO TABS
500.0000 mg | ORAL_TABLET | Freq: Four times a day (QID) | ORAL | 0 refills | Status: DC | PRN
Start: 2023-06-25 — End: 2023-06-25
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
## 2023-06-29 ENCOUNTER — Ambulatory Visit: Payer: Medicaid Other | Admitting: Physician Assistant

## 2023-06-29 ENCOUNTER — Encounter: Payer: Self-pay | Admitting: Physician Assistant

## 2023-06-29 VITALS — BP 114/79 | HR 83

## 2023-06-29 DIAGNOSIS — N319 Neuromuscular dysfunction of bladder, unspecified: Secondary | ICD-10-CM

## 2023-06-29 LAB — BLADDER SCAN AMB NON-IMAGING: Scan Result: 354

## 2023-06-29 NOTE — Progress Notes (Unsigned)
Catheter Removal  Patient is present today for a catheter removal.  10 ml of water was drained from the balloon. A 16FR foley cath was removed from the bladder, no complications were noted. Patient tolerated well.  Performed by: Randa Lynn, RMA  Follow up/ Additional notes: follow up this afternoon.

## 2023-06-30 ENCOUNTER — Other Ambulatory Visit: Payer: Self-pay

## 2023-06-30 ENCOUNTER — Ambulatory Visit: Payer: Medicaid Other | Attending: Nurse Practitioner | Admitting: Physical Therapy

## 2023-06-30 ENCOUNTER — Encounter: Payer: Self-pay | Admitting: Physical Therapy

## 2023-06-30 DIAGNOSIS — Z993 Dependence on wheelchair: Secondary | ICD-10-CM | POA: Insufficient documentation

## 2023-06-30 DIAGNOSIS — M6281 Muscle weakness (generalized): Secondary | ICD-10-CM | POA: Diagnosis present

## 2023-06-30 DIAGNOSIS — G8221 Paraplegia, complete: Secondary | ICD-10-CM | POA: Diagnosis present

## 2023-06-30 DIAGNOSIS — G894 Chronic pain syndrome: Secondary | ICD-10-CM | POA: Diagnosis not present

## 2023-06-30 DIAGNOSIS — R2689 Other abnormalities of gait and mobility: Secondary | ICD-10-CM | POA: Diagnosis present
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
## 2023-07-02 ENCOUNTER — Encounter
Payer: Medicaid Other | Attending: Physical Medicine and Rehabilitation | Admitting: Physical Medicine and Rehabilitation

## 2023-07-02 ENCOUNTER — Encounter: Payer: Self-pay | Admitting: Physical Medicine and Rehabilitation

## 2023-07-02 VITALS — BP 107/75 | HR 92 | Ht 68.0 in

## 2023-07-02 DIAGNOSIS — M549 Dorsalgia, unspecified: Secondary | ICD-10-CM | POA: Insufficient documentation

## 2023-07-02 DIAGNOSIS — G8221 Paraplegia, complete: Secondary | ICD-10-CM | POA: Insufficient documentation

## 2023-07-02 DIAGNOSIS — G8220 Paraplegia, unspecified: Secondary | ICD-10-CM | POA: Insufficient documentation

## 2023-07-02 DIAGNOSIS — Z993 Dependence on wheelchair: Secondary | ICD-10-CM | POA: Diagnosis not present

## 2023-07-02 DIAGNOSIS — K592 Neurogenic bowel, not elsewhere classified: Secondary | ICD-10-CM | POA: Diagnosis not present

## 2023-07-02 DIAGNOSIS — W3400XA Accidental discharge from unspecified firearms or gun, initial encounter: Secondary | ICD-10-CM | POA: Diagnosis not present

## 2023-07-02 DIAGNOSIS — N319 Neuromuscular dysfunction of bladder, unspecified: Secondary | ICD-10-CM | POA: Insufficient documentation
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
## 2023-07-06 ENCOUNTER — Ambulatory Visit: Payer: Medicaid Other

## 2023-07-06 DIAGNOSIS — R2689 Other abnormalities of gait and mobility: Secondary | ICD-10-CM

## 2023-07-06 DIAGNOSIS — G8221 Paraplegia, complete: Secondary | ICD-10-CM | POA: Diagnosis not present

## 2023-07-06 DIAGNOSIS — M6281 Muscle weakness (generalized): Secondary | ICD-10-CM

## 2023-07-08 ENCOUNTER — Ambulatory Visit: Payer: Medicaid Other

## 2023-07-09 ENCOUNTER — Ambulatory Visit: Payer: Medicaid Other | Admitting: Physical Therapy

## 2023-07-11 ENCOUNTER — Other Ambulatory Visit: Payer: Self-pay | Admitting: Physical Medicine and Rehabilitation

## 2023-07-12 ENCOUNTER — Ambulatory Visit: Payer: Medicaid Other | Admitting: Physical Therapy

## 2023-07-13 ENCOUNTER — Ambulatory Visit (INDEPENDENT_AMBULATORY_CARE_PROVIDER_SITE_OTHER): Payer: Medicaid Other | Admitting: Physician Assistant

## 2023-07-13 ENCOUNTER — Ambulatory Visit: Payer: Medicaid Other

## 2023-07-13 ENCOUNTER — Telehealth: Payer: Self-pay | Admitting: Physical Therapy

## 2023-07-13 ENCOUNTER — Other Ambulatory Visit: Payer: Self-pay

## 2023-07-13 DIAGNOSIS — T83018A Breakdown (mechanical) of other indwelling urethral catheter, initial encounter: Secondary | ICD-10-CM

## 2023-07-13 DIAGNOSIS — R339 Retention of urine, unspecified: Secondary | ICD-10-CM

## 2023-07-13 DIAGNOSIS — N319 Neuromuscular dysfunction of bladder, unspecified: Secondary | ICD-10-CM

## 2023-07-13 LAB — URINALYSIS, COMPLETE
Bilirubin, UA: NEGATIVE
Glucose, UA: NEGATIVE
Ketones, UA: NEGATIVE
Nitrite, UA: POSITIVE — AB
Specific Gravity, UA: 1.02 (ref 1.005–1.030)
Urobilinogen, Ur: 0.2 mg/dL (ref 0.2–1.0)
pH, UA: 7 (ref 5.0–7.5)

## 2023-07-13 LAB — MICROSCOPIC EXAMINATION: WBC, UA: >30 /[HPF] — AB (ref 0–5)

## 2023-07-13 MED ORDER — CIPROFLOXACIN HCL 250 MG PO TABS: 250.0000 mg | ORAL_TABLET | Freq: Two times a day (BID) | ORAL | 0 refills | Status: AC

## 2023-07-13 NOTE — Telephone Encounter (Signed)
Patient requesting Eliquis refill

## 2023-07-13 NOTE — Progress Notes (Signed)
Cath Change/ Replacement  Patient is present today for a catheter change due to urinary retention.  8ml of water was removed from the balloon, a 16FR coude foley cath was removed without difficulty.  Patient was cleaned and prepped in a sterile fashion with betadine and 2% lidocaine jelly was instilled into the urethra. A 16 FR coude foley cath was replaced into the bladder, no complications were noted. Urine return was noted and urine was low in color. The balloon was filled with 10ml of sterile water. A night bag was attached for drainage.  Patient tolerated well.    Performed by: Carman Ching, PA-C   Additional notes: He presented to clinic as same-day add-on due to nondraining Foley catheter.  On arrival, he was tearful due to bladder discomfort.  He reports his catheter stopped draining last night.  Urinary sediment and encrustation noted along the night bag tubing.  I think he would be better served with a Silastic catheter due to frequent encrustation, however unfortunately he does not wish to attempt catheterization with straight tip catheters anymore due to some traumatic Foley insertions.  He also discussed that he tried to have sex with the Foley catheter in and it was very uncomfortable and awkward for him.  We discussed that a suprapubic catheter would allow him more freedom for sexual activity and would allow me to place a Silastic catheter on him that will be less likely to become occluded due to encrustation.  He will continue to consider this.  In the meantime, we will treat with Cipro for UTI prevention and to reduce his bacterial population and hopefully improve encrustation rate.  I encouraged him to stay well-hydrated with citric acid containing fluids.  Follow up: Return in about 2 weeks (around 07/27/2023) for Voiding trial.
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
## 2023-07-13 NOTE — Telephone Encounter (Signed)
I called and LVM for his aunt and reminded them of the next appointment and let her no if he no-shows this appointment we will remove all appointments. I left them the main phone number and my phone number so they shouldn't have trouble calling.

## 2023-07-14 ENCOUNTER — Ambulatory Visit: Payer: Medicaid Other

## 2023-07-14 DIAGNOSIS — G8221 Paraplegia, complete: Secondary | ICD-10-CM | POA: Diagnosis not present

## 2023-07-14 DIAGNOSIS — M6281 Muscle weakness (generalized): Secondary | ICD-10-CM

## 2023-07-14 DIAGNOSIS — R2689 Other abnormalities of gait and mobility: Secondary | ICD-10-CM

## 2023-07-14 NOTE — Telephone Encounter (Signed)
Patient's aunt called to request information on medication request , please contact her at [phone]

## 2023-07-15 ENCOUNTER — Ambulatory Visit: Payer: Medicaid Other

## 2023-07-15 DIAGNOSIS — G8221 Paraplegia, complete: Secondary | ICD-10-CM | POA: Diagnosis not present

## 2023-07-15 DIAGNOSIS — M6281 Muscle weakness (generalized): Secondary | ICD-10-CM

## 2023-07-15 DIAGNOSIS — R2689 Other abnormalities of gait and mobility: Secondary | ICD-10-CM

## 2023-07-15 NOTE — Addendum Note (Signed)
Addended by: Claudie Fisherman on: 07/15/2023 01:43 PM   Modules accepted: Orders
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
## 2023-07-16 LAB — CULTURE, URINE COMPREHENSIVE

## 2023-07-19 ENCOUNTER — Ambulatory Visit: Payer: Self-pay | Admitting: Family Medicine

## 2023-07-19 ENCOUNTER — Ambulatory Visit: Payer: Medicaid Other

## 2023-07-19 DIAGNOSIS — R2689 Other abnormalities of gait and mobility: Secondary | ICD-10-CM

## 2023-07-19 DIAGNOSIS — M6281 Muscle weakness (generalized): Secondary | ICD-10-CM

## 2023-07-19 DIAGNOSIS — G8221 Paraplegia, complete: Secondary | ICD-10-CM
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
## 2023-07-21 ENCOUNTER — Ambulatory Visit: Payer: Medicaid Other

## 2023-07-22 ENCOUNTER — Ambulatory Visit: Payer: Medicaid Other | Admitting: Urology

## 2023-07-26 ENCOUNTER — Ambulatory Visit: Payer: Self-pay | Admitting: Family Medicine

## 2023-07-27 ENCOUNTER — Ambulatory Visit: Payer: Medicaid Other | Admitting: Physician Assistant

## 2023-07-27 ENCOUNTER — Encounter: Payer: Self-pay | Admitting: Physician Assistant

## 2023-07-27 VITALS — BP 114/78 | HR 89

## 2023-07-27 DIAGNOSIS — R339 Retention of urine, unspecified: Secondary | ICD-10-CM

## 2023-07-27 DIAGNOSIS — N319 Neuromuscular dysfunction of bladder, unspecified: Secondary | ICD-10-CM

## 2023-07-27 LAB — BLADDER SCAN AMB NON-IMAGING: Scan Result: 83 mL
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
## 2023-07-27 NOTE — Progress Notes (Signed)
Catheter Removal  Patient is present today for a catheter removal.  10ml of water was drained from the balloon. A 16FR foley cath was removed from the bladder, no complications were noted. Patient tolerated well.  Performed by: Randa Lynn, RMA  Follow up/ Additional notes: pt stated his bladder was feeling full even with cath in, I did a bladder scan, PVR 0

## 2023-07-28 ENCOUNTER — Ambulatory Visit: Payer: Medicaid Other | Attending: Nurse Practitioner | Admitting: Physical Therapy

## 2023-07-28 DIAGNOSIS — G8221 Paraplegia, complete: Secondary | ICD-10-CM | POA: Diagnosis present

## 2023-07-28 DIAGNOSIS — S24104A Unspecified injury at T11-T12 level of thoracic spinal cord, initial encounter: Secondary | ICD-10-CM | POA: Insufficient documentation

## 2023-07-28 DIAGNOSIS — M6281 Muscle weakness (generalized): Secondary | ICD-10-CM | POA: Insufficient documentation

## 2023-07-28 DIAGNOSIS — R2689 Other abnormalities of gait and mobility: Secondary | ICD-10-CM | POA: Diagnosis present
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
## 2023-07-29 ENCOUNTER — Ambulatory Visit: Payer: Medicaid Other | Admitting: Physical Therapy

## 2023-08-01 ENCOUNTER — Encounter (HOSPITAL_BASED_OUTPATIENT_CLINIC_OR_DEPARTMENT_OTHER): Payer: Self-pay | Admitting: Emergency Medicine

## 2023-08-01 ENCOUNTER — Emergency Department (HOSPITAL_BASED_OUTPATIENT_CLINIC_OR_DEPARTMENT_OTHER)
Admission: EM | Admit: 2023-08-01 | Discharge: 2023-08-01 | Discharge status: Against Medical Advice | Payer: Medicaid Other | Attending: Emergency Medicine | Admitting: Emergency Medicine

## 2023-08-01 DIAGNOSIS — R339 Retention of urine, unspecified: Secondary | ICD-10-CM | POA: Insufficient documentation

## 2023-08-01 DIAGNOSIS — Z5321 Procedure and treatment not carried out due to patient leaving prior to being seen by health care provider: Secondary | ICD-10-CM | POA: Insufficient documentation

## 2023-08-01 NOTE — ED Triage Notes (Signed)
Foley catheter in place. Reports less urine output than normal. Some fullness of bladder. Noticed today  Refused bladder scan in triage. Urine in catheter back. No discomfort observed or reported

## 2023-08-03 ENCOUNTER — Ambulatory Visit: Payer: Medicaid Other

## 2023-08-03 DIAGNOSIS — R2689 Other abnormalities of gait and mobility: Secondary | ICD-10-CM

## 2023-08-03 DIAGNOSIS — G8221 Paraplegia, complete: Secondary | ICD-10-CM

## 2023-08-03 DIAGNOSIS — M6281 Muscle weakness (generalized): Secondary | ICD-10-CM
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
## 2023-08-05 ENCOUNTER — Ambulatory Visit: Payer: Medicaid Other | Admitting: Physical Therapy

## 2023-08-05 NOTE — Patient Instructions (Signed)
1. Chronic pain syndrome  - Ambulatory referral to Physical Therapy - gabapentin (NEURONTIN) 400 MG capsule; Take 2 capsules (800 mg total) by mouth 3 (three) times daily.  Dispense: 180 capsule; Refill: 5 - Ambulatory referral to Pain Clinic  2. Wheelchair dependence  - Ambulatory referral to Physical Therapy  3. Complete paraplegia (HCC)  -continue to follow with Dr. Alvis Lemmings

## 2023-08-09 ENCOUNTER — Ambulatory Visit: Payer: Medicaid Other | Admitting: Physician Assistant

## 2023-08-11 ENCOUNTER — Ambulatory Visit: Payer: Medicaid Other

## 2023-08-11 DIAGNOSIS — R2689 Other abnormalities of gait and mobility: Secondary | ICD-10-CM

## 2023-08-11 DIAGNOSIS — S24104A Unspecified injury at T11-T12 level of thoracic spinal cord, initial encounter: Secondary | ICD-10-CM

## 2023-08-11 DIAGNOSIS — G8221 Paraplegia, complete: Secondary | ICD-10-CM | POA: Diagnosis not present

## 2023-08-11 DIAGNOSIS — M6281 Muscle weakness (generalized): Secondary | ICD-10-CM
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
## 2023-08-12 ENCOUNTER — Ambulatory Visit: Payer: Medicaid Other

## 2023-08-12 DIAGNOSIS — M6281 Muscle weakness (generalized): Secondary | ICD-10-CM

## 2023-08-12 DIAGNOSIS — G8221 Paraplegia, complete: Secondary | ICD-10-CM

## 2023-08-12 DIAGNOSIS — R2689 Other abnormalities of gait and mobility: Secondary | ICD-10-CM
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
## 2023-08-13 ENCOUNTER — Ambulatory Visit: Payer: Medicaid Other | Admitting: Physical Therapy

## 2023-08-16 ENCOUNTER — Other Ambulatory Visit: Payer: Self-pay

## 2023-08-16 ENCOUNTER — Emergency Department (HOSPITAL_BASED_OUTPATIENT_CLINIC_OR_DEPARTMENT_OTHER)
Admission: EM | Admit: 2023-08-16 | Discharge: 2023-08-16 | Disposition: A | Discharge status: Home / Self Care | Payer: Medicaid Other | Attending: Emergency Medicine | Admitting: Emergency Medicine

## 2023-08-16 ENCOUNTER — Encounter (HOSPITAL_BASED_OUTPATIENT_CLINIC_OR_DEPARTMENT_OTHER): Payer: Self-pay | Admitting: *Deleted

## 2023-08-16 DIAGNOSIS — Y69 Unspecified misadventure during surgical and medical care: Secondary | ICD-10-CM | POA: Insufficient documentation

## 2023-08-16 DIAGNOSIS — Z7901 Long term (current) use of anticoagulants: Secondary | ICD-10-CM | POA: Insufficient documentation

## 2023-08-16 DIAGNOSIS — T83091A Other mechanical complication of indwelling urethral catheter, initial encounter: Secondary | ICD-10-CM

## 2023-08-16 DIAGNOSIS — T83098A Other mechanical complication of other indwelling urethral catheter, initial encounter: Secondary | ICD-10-CM | POA: Diagnosis present

## 2023-08-16 LAB — URINALYSIS, ROUTINE W REFLEX MICROSCOPIC
Bilirubin Urine: NEGATIVE
Glucose, UA: NEGATIVE mg/dL
Ketones, ur: NEGATIVE mg/dL
Nitrite: POSITIVE — AB
RBC / HPF: >50 RBC/hpf (ref 0–5)
Specific Gravity, Urine: 1.011 (ref 1.005–1.030)
WBC, UA: >50 WBC/hpf (ref 0–5)
pH: 8 (ref 5.0–8.0)

## 2023-08-16 MED ORDER — CEFDINIR 300 MG PO CAPS
300.0000 mg | ORAL_CAPSULE | Freq: Two times a day (BID) | ORAL | Status: DC
  Administered 2023-08-16: 300 mg via ORAL
  Filled 2023-08-16: qty 1

## 2023-08-16 MED ORDER — CEFUROXIME AXETIL 500 MG PO TABS: 500.0000 mg | ORAL_TABLET | Freq: Two times a day (BID) | ORAL | 0 refills | Status: DC

## 2023-08-16 NOTE — Discharge Instructions (Signed)
Your catheter was irrigated and is flowing appropriately.  Take the antibiotics as prescribed below the urine culture is pending.  You will be called if the urine culture is positive and your antibiotic needs to be changed.  Follow-up with the urologist.  Return to the ED with catheter not draining, abdominal pain, fever, vomiting or other concerns.

## 2023-08-16 NOTE — ED Notes (Signed)
Pt says that his foley catheter was pulled and he had blood in the catheter and its not draining. He says he emptied his catheter about 30 minutes prior to arrival- approx 300 clear yellow urine in his catheter bag.
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
## 2023-08-17 ENCOUNTER — Telehealth: Payer: Self-pay | Admitting: *Deleted

## 2023-08-17 ENCOUNTER — Ambulatory Visit: Payer: Medicaid Other

## 2023-08-17 LAB — URINE CULTURE

## 2023-08-17 NOTE — Telephone Encounter (Signed)
Patient called in today and states they went to er yesterday because he was having pain to cathter site. The er did not change his cath per patient. They states he has some gel to go around his penis area. Advised to [place] that . The urine is draining into the bag. Advised that is a good sign and drink water . Please call the office if the urine stops draining

## 2023-08-18 ENCOUNTER — Encounter: Payer: Self-pay | Admitting: Urology

## 2023-08-18 ENCOUNTER — Ambulatory Visit: Payer: Medicaid Other | Admitting: Urology

## 2023-08-18 ENCOUNTER — Telehealth: Payer: Self-pay

## 2023-08-18 DIAGNOSIS — T839XXA Unspecified complication of genitourinary prosthetic device, implant and graft, initial encounter: Secondary | ICD-10-CM

## 2023-08-18 DIAGNOSIS — N319 Neuromuscular dysfunction of bladder, unspecified: Secondary | ICD-10-CM

## 2023-08-18 DIAGNOSIS — T839XXS Unspecified complication of genitourinary prosthetic device, implant and graft, sequela: Secondary | ICD-10-CM

## 2023-08-18 DIAGNOSIS — S3730XA Unspecified injury of urethra, initial encounter: Secondary | ICD-10-CM | POA: Diagnosis not present
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
## 2023-08-18 NOTE — Telephone Encounter (Signed)
Pt did not arrive for scheduled appointment. No telephone call nor message preceeded this absence. Author attempted to contact pt via telephone number listed in chart.Phone answered by aunt, reports they had schedule misread, did not think they were back until December.   1:56 PM, 08/18/23 Rosamaria Lints, PT, DPT Physical Therapist - [facility]  [phone] [facility])

## 2023-08-23 ENCOUNTER — Emergency Department (HOSPITAL_BASED_OUTPATIENT_CLINIC_OR_DEPARTMENT_OTHER)
Admission: EM | Admit: 2023-08-23 | Discharge: 2023-08-23 | Disposition: A | Discharge status: Home / Self Care | Payer: Medicaid Other | Attending: Emergency Medicine | Admitting: Emergency Medicine

## 2023-08-23 ENCOUNTER — Encounter (HOSPITAL_BASED_OUTPATIENT_CLINIC_OR_DEPARTMENT_OTHER): Payer: Self-pay

## 2023-08-23 DIAGNOSIS — Z7901 Long term (current) use of anticoagulants: Secondary | ICD-10-CM | POA: Diagnosis not present

## 2023-08-23 DIAGNOSIS — Z79899 Other long term (current) drug therapy: Secondary | ICD-10-CM | POA: Insufficient documentation

## 2023-08-23 DIAGNOSIS — R1030 Lower abdominal pain, unspecified: Secondary | ICD-10-CM | POA: Diagnosis present

## 2023-08-23 DIAGNOSIS — T83091A Other mechanical complication of indwelling urethral catheter, initial encounter: Secondary | ICD-10-CM | POA: Insufficient documentation

## 2023-08-23 MED ORDER — LIDOCAINE HCL URETHRAL/MUCOSAL 2 % EX GEL
1.0000 | Freq: Once | CUTANEOUS | Status: AC
  Administered 2023-08-23: 1 via URETHRAL
  Filled 2023-08-23: qty 11
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
## 2023-08-23 NOTE — ED Notes (Signed)
Patient eloped from department. Provider made aware

## 2023-08-23 NOTE — ED Notes (Signed)
Patient requesting to leave. Aware that provider will type up discharge papers in few minutes.

## 2023-08-23 NOTE — ED Notes (Signed)
ED Provider at bedside. 

## 2023-08-23 NOTE — ED Notes (Signed)
Attempted to irrigate foley unsuccessfully. Patient very anxious with same. States "my anxiety gets bad when anyone messes with my foley". MD updated

## 2023-08-24 LAB — URINE CULTURE: Culture: NO GROWTH

## 2023-08-26 ENCOUNTER — Ambulatory Visit: Payer: Medicaid Other

## 2023-08-26 DIAGNOSIS — G8221 Paraplegia, complete: Secondary | ICD-10-CM | POA: Insufficient documentation

## 2023-08-26 DIAGNOSIS — M6281 Muscle weakness (generalized): Secondary | ICD-10-CM | POA: Insufficient documentation

## 2023-08-26 DIAGNOSIS — S24104A Unspecified injury at T11-T12 level of thoracic spinal cord, initial encounter: Secondary | ICD-10-CM | POA: Insufficient documentation

## 2023-08-26 DIAGNOSIS — R2689 Other abnormalities of gait and mobility: Secondary | ICD-10-CM | POA: Insufficient documentation
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
## 2023-08-27 ENCOUNTER — Ambulatory Visit: Payer: Medicaid Other | Admitting: Physical Therapy

## 2023-08-30 ENCOUNTER — Ambulatory Visit: Payer: Medicaid Other | Admitting: Physician Assistant

## 2023-08-30 ENCOUNTER — Telehealth: Payer: Self-pay | Admitting: Family Medicine

## 2023-08-30 DIAGNOSIS — R8290 Unspecified abnormal findings in urine: Secondary | ICD-10-CM

## 2023-08-30 DIAGNOSIS — K592 Neurogenic bowel, not elsewhere classified: Secondary | ICD-10-CM

## 2023-08-30 DIAGNOSIS — N319 Neuromuscular dysfunction of bladder, unspecified: Secondary | ICD-10-CM

## 2023-08-30 LAB — BLADDER SCAN AMB NON-IMAGING: Scan Result: 102

## 2023-08-30 NOTE — Telephone Encounter (Signed)
Call placed to patient unable to reach message left on VM.   

## 2023-08-30 NOTE — Progress Notes (Signed)
Catheter Removal  Patient is present today for a catheter removal.  9 ml of water was drained from the balloon. A 16FR foley cath was removed from the bladder, no complications were noted. Patient tolerated well.  Performed by: Dandrae Kustra H RMA  Follow up/ Additional notes: f/u in the pm
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
## 2023-08-30 NOTE — Telephone Encounter (Signed)
Copied from CRM [id number]. Topic: General - Inquiry >> Aug 30, 2023 12:27 PM Shon Hale wrote: Reason for CRM: Kennon Rounds calling to speak to nurse about patient's bowel management program. Is that the only option? Aunt informed to reach out to PCP in regards to this. Aunt unsure of who informed her to reach out to PCP.   Aunt not sure who she is supposed to be speaking to about this, but would like to be informed who to speak to.    Please call Aunt back, [phone]

## 2023-08-30 NOTE — Patient Instructions (Signed)
Vinegar Bladder Irrigation Protocol  Please start irrigating your bladder with a vinegar solution as described below to reduce urinary encrustation.  Most grocery stores carry white vinegar as a 5% solution. To make an appropriate bladder irrigation solution, you will need to dilute this at a ratio of roughly 20:1.  To achieve this, see the chart below to determine what amount of 5% white vinegar solution you should mix with your normal bladder irrigation (sterile water from the pharmacy).  If you are starting with a water volume of... ... , then add 2.5 tsp (12.40mL) of white vinegar .Marland KitchenMarland Kitchen , then add 5 tsp (25mL) of white vinegar .Marland Kitchen. , then add 10 tsp (50mL) of white vinegar ...1 gallon, then add about 6 oz of white vinegar  Instill 60mL of the vinegar solution through your catheter into the bladder once daily. Plug the catheter and leave the solution in the bladder for 15 minutes each time, then drain. Discontinue irrigation if it causes pain or discomfort. You may store any leftover solution in the refrigerator for up to 1 week.

## 2023-08-31 ENCOUNTER — Ambulatory Visit: Payer: Medicaid Other | Attending: Nurse Practitioner

## 2023-08-31 DIAGNOSIS — S24104A Unspecified injury at T11-T12 level of thoracic spinal cord, initial encounter: Secondary | ICD-10-CM | POA: Diagnosis present

## 2023-08-31 DIAGNOSIS — G8221 Paraplegia, complete: Secondary | ICD-10-CM | POA: Diagnosis present

## 2023-08-31 DIAGNOSIS — R2689 Other abnormalities of gait and mobility: Secondary | ICD-10-CM

## 2023-08-31 DIAGNOSIS — M6281 Muscle weakness (generalized): Secondary | ICD-10-CM
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
## 2023-08-31 NOTE — Telephone Encounter (Signed)
Spoke with patient 's Aunt Nancy Marus. Advised that referral for GI was placed on yesterday by Nicholes Rough UROLOGICAL ASSOCIATE

## 2023-09-02 ENCOUNTER — Ambulatory Visit: Payer: Medicaid Other

## 2023-09-02 DIAGNOSIS — S24104A Unspecified injury at T11-T12 level of thoracic spinal cord, initial encounter: Secondary | ICD-10-CM

## 2023-09-02 DIAGNOSIS — R2689 Other abnormalities of gait and mobility: Secondary | ICD-10-CM

## 2023-09-02 DIAGNOSIS — G8221 Paraplegia, complete: Secondary | ICD-10-CM

## 2023-09-02 DIAGNOSIS — M6281 Muscle weakness (generalized): Secondary | ICD-10-CM
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
## 2023-09-06 ENCOUNTER — Ambulatory Visit: Payer: Medicaid Other

## 2023-09-06 DIAGNOSIS — R2689 Other abnormalities of gait and mobility: Secondary | ICD-10-CM

## 2023-09-06 DIAGNOSIS — M6281 Muscle weakness (generalized): Secondary | ICD-10-CM

## 2023-09-06 DIAGNOSIS — S24104A Unspecified injury at T11-T12 level of thoracic spinal cord, initial encounter: Secondary | ICD-10-CM

## 2023-09-06 DIAGNOSIS — G8221 Paraplegia, complete: Secondary | ICD-10-CM
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
## 2023-09-08 ENCOUNTER — Ambulatory Visit: Payer: Medicaid Other | Admitting: Physical Therapy

## 2023-09-09 ENCOUNTER — Ambulatory Visit: Payer: Medicaid Other | Admitting: Physical Therapy
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
## 2023-09-13 ENCOUNTER — Ambulatory Visit: Payer: Medicaid Other

## 2023-09-13 DIAGNOSIS — G8221 Paraplegia, complete: Secondary | ICD-10-CM

## 2023-09-13 DIAGNOSIS — M6281 Muscle weakness (generalized): Secondary | ICD-10-CM

## 2023-09-13 DIAGNOSIS — R2689 Other abnormalities of gait and mobility: Secondary | ICD-10-CM

## 2023-09-16 ENCOUNTER — Ambulatory Visit: Payer: Medicaid Other | Admitting: Physical Therapy

## 2023-09-20 ENCOUNTER — Ambulatory Visit: Payer: Medicaid Other | Admitting: Physical Therapy

## 2023-09-20 ENCOUNTER — Telehealth: Payer: Self-pay

## 2023-09-20 NOTE — Telephone Encounter (Signed)
Pt did not arrive for scheduled appointment. No telephone call nor message preceeded this absence. Author attempted to contact pt via telephone number listed in chart. Called # in chart, Spoke to [place], pt's primary contact. [name] reports pt/family unaware of scheduled visit for today, pt made alternative plans, is not at home at present. [name] made aware of next scheduled visit.   4:48 PM, 09/20/23 Rosamaria Lints, PT, DPT Physical Therapist - [facility]  Outpatient Physical Therapy- Main Campus [phone]

## 2023-09-23 ENCOUNTER — Telehealth: Payer: Self-pay

## 2023-09-23 ENCOUNTER — Ambulatory Visit: Payer: Medicaid Other | Attending: Nurse Practitioner

## 2023-09-23 DIAGNOSIS — G8221 Paraplegia, complete: Secondary | ICD-10-CM | POA: Insufficient documentation

## 2023-09-23 DIAGNOSIS — M6281 Muscle weakness (generalized): Secondary | ICD-10-CM | POA: Insufficient documentation

## 2023-09-23 DIAGNOSIS — S24104A Unspecified injury at T11-T12 level of thoracic spinal cord, initial encounter: Secondary | ICD-10-CM | POA: Insufficient documentation

## 2023-09-23 DIAGNOSIS — R2689 Other abnormalities of gait and mobility: Secondary | ICD-10-CM | POA: Insufficient documentation

## 2023-09-23 NOTE — Telephone Encounter (Signed)
 Patient called due to no show. Left voicemail with appt time of next appointment.   Precious Bard, PT, DPT Physical Therapist - [facility]  Outpatient Physical Therapy- Main Campus [phone]

## 2023-09-27 ENCOUNTER — Ambulatory Visit: Payer: Medicaid Other | Admitting: Physical Therapy

## 2023-09-27 ENCOUNTER — Ambulatory Visit (INDEPENDENT_AMBULATORY_CARE_PROVIDER_SITE_OTHER): Payer: Medicaid Other | Admitting: Physician Assistant

## 2023-09-27 DIAGNOSIS — M6281 Muscle weakness (generalized): Secondary | ICD-10-CM

## 2023-09-27 DIAGNOSIS — R339 Retention of urine, unspecified: Secondary | ICD-10-CM | POA: Diagnosis not present

## 2023-09-27 DIAGNOSIS — G8221 Paraplegia, complete: Secondary | ICD-10-CM

## 2023-09-27 DIAGNOSIS — R2689 Other abnormalities of gait and mobility: Secondary | ICD-10-CM

## 2023-09-27 DIAGNOSIS — S24104A Unspecified injury at T11-T12 level of thoracic spinal cord, initial encounter: Secondary | ICD-10-CM | POA: Diagnosis present

## 2023-09-27 DIAGNOSIS — T839XXS Unspecified complication of genitourinary prosthetic device, implant and graft, sequela: Secondary | ICD-10-CM
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
## 2023-09-27 NOTE — Patient Instructions (Signed)
Vinegar Bladder Irrigation Protocol  Please start irrigating your bladder with a vinegar solution as described below to reduce urinary encrustation.  Most grocery stores carry white vinegar as a 5% solution. To make an appropriate bladder irrigation solution, you will need to dilute this at a ratio of roughly 20:1.  To achieve this, see the chart below to determine what amount of 5% white vinegar solution you should mix with your normal bladder irrigation (homemade saline or sterile saline from the pharmacy).  If you are starting with a saline volume of... ...250mL, then add 2.5 tsp (12.5mL) of white vinegar ...500mL, then add 5 tsp (25mL) of white vinegar ...1000mL, then add 10 tsp (50mL) of white vinegar ...1 gallon, then add about 6 oz of white vinegar  Instill 30mL of the vinegar solution through your catheter into the bladder three times daily. Leave the solution in the bladder for 10 minutes each time, then drain. Discontinue irrigation if it causes pain or discomfort. You may store any leftover solution in the refrigerator for up to 1 week.  

## 2023-09-27 NOTE — Progress Notes (Signed)
 Patient presented to clinic today for urgent add-on due to poor Foley drainage for several days. He reports bladder fullness/discomfort.  Cath Change/ Replacement  Patient is present today for a catheter change due to urinary retention.  8ml of water  was removed from the balloon, a 16FR coude foley cath was removed with some difficulty due to catheter encrustation.  Patient was cleaned and prepped in a sterile fashion with betadine and 2% lidocaine  jelly was instilled into the urethra. A 16 FR coude  foley cath was replaced into the bladder, no complications were noted. Urine return was noted and urine was yellow in color. The balloon was filled with 10ml of sterile water . A night bag was attached for drainage.  Patient tolerated well.    Performed by: Delisha Peaden, PA-C  Additional notes: Gave vinegar bladder instillation instructions again, encouraged him to stay hydrated with 64-80oz of fluid daily.  Follow up: Return in about 2 weeks (around 10/11/2023) for Voiding trial.

## 2023-09-29 ENCOUNTER — Ambulatory Visit: Payer: Medicaid Other | Admitting: Physician Assistant

## 2023-09-29 ENCOUNTER — Ambulatory Visit: Payer: Medicaid Other | Admitting: Physical Therapy

## 2023-09-30 ENCOUNTER — Ambulatory Visit: Payer: Medicaid Other

## 2023-09-30 DIAGNOSIS — R2689 Other abnormalities of gait and mobility: Secondary | ICD-10-CM

## 2023-09-30 DIAGNOSIS — M6281 Muscle weakness (generalized): Secondary | ICD-10-CM

## 2023-09-30 DIAGNOSIS — G8221 Paraplegia, complete: Secondary | ICD-10-CM
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
## 2023-10-04 ENCOUNTER — Ambulatory Visit: Payer: Medicaid Other | Admitting: Physical Therapy

## 2023-10-04 DIAGNOSIS — G8221 Paraplegia, complete: Secondary | ICD-10-CM

## 2023-10-04 DIAGNOSIS — R2689 Other abnormalities of gait and mobility: Secondary | ICD-10-CM

## 2023-10-04 DIAGNOSIS — S24104A Unspecified injury at T11-T12 level of thoracic spinal cord, initial encounter: Secondary | ICD-10-CM

## 2023-10-04 DIAGNOSIS — M6281 Muscle weakness (generalized): Secondary | ICD-10-CM
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
## 2023-10-06 ENCOUNTER — Telehealth: Payer: Self-pay

## 2023-10-06 ENCOUNTER — Ambulatory Visit: Payer: Medicaid Other

## 2023-10-06 NOTE — Telephone Encounter (Signed)
 Patient called and VM left due to no show. VM included time and date of next appt.    Lee [name]  Brain Cahill, PT, DPT Physical Therapist - [facility]  Outpatient Physical Therapy- [facility] [phone]

## 2023-10-11 ENCOUNTER — Emergency Department
Admission: EM | Admit: 2023-10-11 | Discharge: 2023-10-11 | Disposition: A | Discharge status: Home / Self Care | Payer: Medicaid Other | Attending: Emergency Medicine | Admitting: Emergency Medicine

## 2023-10-11 ENCOUNTER — Other Ambulatory Visit: Payer: Self-pay

## 2023-10-11 ENCOUNTER — Encounter: Payer: Self-pay | Admitting: Physical Medicine and Rehabilitation

## 2023-10-11 ENCOUNTER — Encounter
Payer: Medicaid Other | Attending: Physical Medicine and Rehabilitation | Admitting: Physical Medicine and Rehabilitation

## 2023-10-11 VITALS — BP 116/72 | HR 85

## 2023-10-11 DIAGNOSIS — N319 Neuromuscular dysfunction of bladder, unspecified: Secondary | ICD-10-CM | POA: Insufficient documentation

## 2023-10-11 DIAGNOSIS — K592 Neurogenic bowel, not elsewhere classified: Secondary | ICD-10-CM | POA: Diagnosis present

## 2023-10-11 DIAGNOSIS — T83098A Other mechanical complication of other indwelling urethral catheter, initial encounter: Secondary | ICD-10-CM | POA: Insufficient documentation

## 2023-10-11 DIAGNOSIS — Z993 Dependence on wheelchair: Secondary | ICD-10-CM | POA: Insufficient documentation

## 2023-10-11 DIAGNOSIS — R519 Headache, unspecified: Secondary | ICD-10-CM | POA: Diagnosis present

## 2023-10-11 DIAGNOSIS — Y732 Prosthetic and other implants, materials and accessory gastroenterology and urology devices associated with adverse incidents: Secondary | ICD-10-CM | POA: Insufficient documentation

## 2023-10-11 DIAGNOSIS — T83091A Other mechanical complication of indwelling urethral catheter, initial encounter: Secondary | ICD-10-CM

## 2023-10-11 DIAGNOSIS — G8221 Paraplegia, complete: Secondary | ICD-10-CM | POA: Diagnosis not present

## 2023-10-11 MED ORDER — GABAPENTIN 800 MG PO TABS: 800.0000 mg | ORAL_TABLET | Freq: Three times a day (TID) | ORAL | 5 refills | Status: DC

## 2023-10-11 MED ORDER — TOPIRAMATE 25 MG PO TABS: 25.0000 mg | ORAL_TABLET | Freq: Every day | ORAL | 5 refills | Status: DC

## 2023-10-11 MED ORDER — BACLOFEN 10 MG PO TABS: 10.0000 mg | ORAL_TABLET | Freq: Three times a day (TID) | ORAL | 5 refills | Status: DC
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
## 2023-10-11 NOTE — ED Triage Notes (Signed)
Pt states his foley catheter has been clogged tonight, pt reports trying to flush with a vinegar solution at home with no relief. Pt denies noticing any blood in his urine. Pt states the foley was placed last year.

## 2023-10-12 ENCOUNTER — Ambulatory Visit: Payer: Medicaid Other

## 2023-10-12 DIAGNOSIS — R2689 Other abnormalities of gait and mobility: Secondary | ICD-10-CM | POA: Diagnosis not present

## 2023-10-12 DIAGNOSIS — M6281 Muscle weakness (generalized): Secondary | ICD-10-CM

## 2023-10-12 DIAGNOSIS — G8221 Paraplegia, complete: Secondary | ICD-10-CM

## 2023-10-13 ENCOUNTER — Ambulatory Visit: Payer: Medicaid Other | Admitting: Physician Assistant

## 2023-10-13 DIAGNOSIS — N319 Neuromuscular dysfunction of bladder, unspecified: Secondary | ICD-10-CM

## 2023-10-13 DIAGNOSIS — R8290 Unspecified abnormal findings in urine: Secondary | ICD-10-CM | POA: Diagnosis not present

## 2023-10-13 LAB — URINE CULTURE: Culture: 70000 — AB

## 2023-10-13 LAB — BLADDER SCAN AMB NON-IMAGING
Scan Result: 15
Scan Result: 506
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
## 2023-10-13 NOTE — Progress Notes (Signed)
Fill and Pull Catheter Removal  Patient is present today for a catheter removal. A 16FR foley cath was removed from the bladder complications were noted as: patient is complaining of abdominal pain.  He says he asked to have a coude placed and the hospital put a french in.  Bladder was scanned per patient request with a result of 15ml  .  Catheter was removed and patient tolerated well.    Performed by: S.Naol Ontiveros, CMA  Follow up/ Additional notes: advised patient to return at 4pm and to drink fluids

## 2023-10-14 ENCOUNTER — Other Ambulatory Visit: Payer: Self-pay

## 2023-10-14 ENCOUNTER — Ambulatory Visit: Payer: Medicaid Other | Admitting: Physical Therapy

## 2023-10-14 ENCOUNTER — Encounter: Payer: Self-pay | Admitting: Emergency Medicine

## 2023-10-14 DIAGNOSIS — R0789 Other chest pain: Secondary | ICD-10-CM | POA: Insufficient documentation

## 2023-10-14 DIAGNOSIS — G8221 Paraplegia, complete: Secondary | ICD-10-CM

## 2023-10-14 DIAGNOSIS — M6281 Muscle weakness (generalized): Secondary | ICD-10-CM

## 2023-10-14 DIAGNOSIS — Z1152 Encounter for screening for COVID-19: Secondary | ICD-10-CM | POA: Diagnosis not present

## 2023-10-14 DIAGNOSIS — R2689 Other abnormalities of gait and mobility: Secondary | ICD-10-CM

## 2023-10-14 DIAGNOSIS — R079 Chest pain, unspecified: Secondary | ICD-10-CM | POA: Diagnosis present

## 2023-10-14 DIAGNOSIS — S24104A Unspecified injury at T11-T12 level of thoracic spinal cord, initial encounter: Secondary | ICD-10-CM

## 2023-10-14 NOTE — ED Triage Notes (Signed)
Patient wheeled to triage with complaints of chest pain that started a couple of hours ago. Patient states it feels heavy and hard to breathe. Patient presents with intact foley catheter and back brace.

## 2023-10-14 NOTE — Progress Notes (Signed)
ED Antimicrobial Stewardship Positive Culture Follow Up   Lee [name] is an 18 y.o. male who presented to [facility] on 10/11/2023 with a chief complaint of clogged foley.   Chief Complaint  Patient presents with   Foley Catheter Problem   Recent Results (from the past 720 hours)  Urine Culture     Status: Abnormal   Collection Time: 10/11/23  5:10 AM   Specimen: Urine, Catheterized  Result Value Ref Range Status   Specimen Description   Final    URINE, CATHETERIZED Performed at [facility], [address] [place], [place] [address]    Special Requests   Final    NONE Performed at [facility], [address] [place], [place] [address]    Culture (A)  Final    70,000 COLONIES/mL PSEUDOMONAS AERUGINOSA >=100,000 COLONIES/mL ENTEROCOCCUS FAECALIS    Report Status 10/13/2023 FINAL  Final   Organism ID, Bacteria PSEUDOMONAS AERUGINOSA (A)  Final   Organism ID, Bacteria ENTEROCOCCUS FAECALIS (A)  Final      Susceptibility   Enterococcus faecalis - MIC*    AMPICILLIN <=2 SENSITIVE Sensitive     NITROFURANTOIN <=16 SENSITIVE Sensitive     VANCOMYCIN 1 SENSITIVE Sensitive     * >=100,000 COLONIES/mL ENTEROCOCCUS FAECALIS   Pseudomonas aeruginosa - MIC*    CEFTAZIDIME 4 SENSITIVE Sensitive     CIPROFLOXACIN <=0.25 SENSITIVE Sensitive     GENTAMICIN <=1 SENSITIVE Sensitive     IMIPENEM 1 SENSITIVE Sensitive     PIP/TAZO <=4 SENSITIVE Sensitive ug/mL    CEFEPIME 2 SENSITIVE Sensitive     * 70,000 COLONIES/mL PSEUDOMONAS AERUGINOSA    [x]  Patient discharged originally without antimicrobial agent   New antibiotic prescription: NA  ED Provider: Reached out for Carman Ching the PA follow this patient through urology. They had an appointment with them 1/22 and believe culture results represent more colonization versus infection.   Effie Shy, PharmD Pharmacy Resident  10/14/2023 3:02 PM
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
## 2023-10-15 ENCOUNTER — Emergency Department
Admission: EM | Admit: 2023-10-15 | Discharge: 2023-10-15 | Disposition: A | Discharge status: Home / Self Care | Payer: Medicaid Other | Attending: Emergency Medicine | Admitting: Emergency Medicine

## 2023-10-15 ENCOUNTER — Emergency Department: Payer: Medicaid Other

## 2023-10-15 ENCOUNTER — Telehealth: Payer: Self-pay

## 2023-10-15 DIAGNOSIS — R0789 Other chest pain: Secondary | ICD-10-CM

## 2023-10-15 LAB — D-DIMER, QUANTITATIVE: D-Dimer, Quant: <0.27 ug{FEU}/mL (ref 0.00–0.50)

## 2023-10-15 LAB — CBC
HCT: 43.9 % (ref 39.0–52.0)
Hemoglobin: 13.6 g/dL (ref 13.0–17.0)
MCH: 27.1 pg (ref 26.0–34.0)
MCHC: 31 g/dL (ref 30.0–36.0)
MCV: 87.6 fL (ref 80.0–100.0)
Platelets: 211 10*3/uL (ref 150–400)
RBC: 5.01 MIL/uL (ref 4.22–5.81)
RDW: 14.2 % (ref 11.5–15.5)
WBC: 6.5 10*3/uL (ref 4.0–10.5)
nRBC: 0 % (ref 0.0–0.2)

## 2023-10-15 LAB — BASIC METABOLIC PANEL
Anion gap: 9 (ref 5–15)
BUN: 10 mg/dL (ref 6–20)
CO2: 27 mmol/L (ref 22–32)
Calcium: 9.4 mg/dL (ref 8.9–10.3)
Chloride: 106 mmol/L (ref 98–111)
Creatinine, Ser: 0.67 mg/dL (ref 0.61–1.24)
GFR, Estimated: >60 mL/min (ref >60)
Glucose, Bld: 116 mg/dL — ABNORMAL HIGH (ref 70–99)
Potassium: 3.9 mmol/L (ref 3.5–5.1)
Sodium: 142 mmol/L (ref 135–145)

## 2023-10-15 LAB — RESP PANEL BY RT-PCR (RSV, FLU A&B, COVID)  RVPGX2
Influenza A by PCR: NEGATIVE
Influenza B by PCR: NEGATIVE
Resp Syncytial Virus by PCR: NEGATIVE
SARS Coronavirus 2 by RT PCR: NEGATIVE

## 2023-10-15 LAB — TROPONIN I (HIGH SENSITIVITY)
Troponin I (High Sensitivity): <2 ng/L (ref <18)
Troponin I (High Sensitivity): <2 ng/L (ref <18)

## 2023-10-15 NOTE — Telephone Encounter (Signed)
Copied from CRM [id number]. Topic: General - Other >> Oct 15, 2023  3:16 PM Payton Doughty wrote: Reason for CRM:  Lee [name] w/ the pt's incontinence supply provider calling to ask if we have received order for his incontinence supplies.  They faxed it on the 17th.  She is going to send to the pt's urologist as well, as we have not seen pt since 03/2023.    Form has been faxed to [facility].
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
## 2023-10-15 NOTE — Discharge Instructions (Signed)
You have been seen in the Emergency Department (ED) today for chest pain.  As we have discussed today's test results are normal, and we believe your pain is due to pain/strain and/or inflammation of the muscles and/or cartilage of your chest wall.  We recommend you take ibuprofen 600 mg three times a day with meals for the next 5 days (unless you have been told previously not to take ibuprofen or NSAIDs in general).  You may also take Tylenol according to the label instructions.  Read through the included information for additional treatment recommendations and precautions.  Continue to take your regular medications.   Return to the Emergency Department (ED) if you experience any further chest pain/pressure/tightness, difficulty breathing, or sudden sweating, or other symptoms that concern you.

## 2023-10-18 ENCOUNTER — Ambulatory Visit: Payer: Medicaid Other

## 2023-10-18 DIAGNOSIS — R2689 Other abnormalities of gait and mobility: Secondary | ICD-10-CM | POA: Diagnosis not present

## 2023-10-18 DIAGNOSIS — M6281 Muscle weakness (generalized): Secondary | ICD-10-CM

## 2023-10-18 DIAGNOSIS — G8221 Paraplegia, complete: Secondary | ICD-10-CM
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
## 2023-10-20 ENCOUNTER — Ambulatory Visit: Payer: Medicaid Other
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
## 2023-10-21 ENCOUNTER — Ambulatory Visit: Payer: Medicaid Other | Admitting: Physical Therapy

## 2023-10-21 DIAGNOSIS — S24104A Unspecified injury at T11-T12 level of thoracic spinal cord, initial encounter: Secondary | ICD-10-CM

## 2023-10-21 DIAGNOSIS — M6281 Muscle weakness (generalized): Secondary | ICD-10-CM

## 2023-10-21 DIAGNOSIS — R2689 Other abnormalities of gait and mobility: Secondary | ICD-10-CM

## 2023-10-21 DIAGNOSIS — G8221 Paraplegia, complete: Secondary | ICD-10-CM
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
## 2023-10-23 ENCOUNTER — Other Ambulatory Visit: Payer: Self-pay

## 2023-10-23 ENCOUNTER — Emergency Department (HOSPITAL_BASED_OUTPATIENT_CLINIC_OR_DEPARTMENT_OTHER)
Admission: EM | Admit: 2023-10-23 | Discharge: 2023-10-24 | Disposition: A | Discharge status: Home / Self Care | Payer: Medicaid Other | Attending: Emergency Medicine | Admitting: Emergency Medicine

## 2023-10-23 ENCOUNTER — Encounter (HOSPITAL_BASED_OUTPATIENT_CLINIC_OR_DEPARTMENT_OTHER): Payer: Self-pay

## 2023-10-23 DIAGNOSIS — T83098A Other mechanical complication of other indwelling urethral catheter, initial encounter: Secondary | ICD-10-CM | POA: Diagnosis not present

## 2023-10-23 DIAGNOSIS — R1030 Lower abdominal pain, unspecified: Secondary | ICD-10-CM | POA: Diagnosis present

## 2023-10-23 DIAGNOSIS — Y732 Prosthetic and other implants, materials and accessory gastroenterology and urology devices associated with adverse incidents: Secondary | ICD-10-CM | POA: Insufficient documentation

## 2023-10-23 DIAGNOSIS — T83091A Other mechanical complication of indwelling urethral catheter, initial encounter: Secondary | ICD-10-CM

## 2023-10-23 NOTE — ED Triage Notes (Signed)
Patient states noticed lower abdominal discomfort around 5 pm and states foley not draining
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
## 2023-10-25 ENCOUNTER — Ambulatory Visit: Payer: Medicaid Other | Attending: Nurse Practitioner | Admitting: Physical Therapy

## 2023-10-25 DIAGNOSIS — N319 Neuromuscular dysfunction of bladder, unspecified: Secondary | ICD-10-CM | POA: Insufficient documentation

## 2023-10-25 DIAGNOSIS — R2689 Other abnormalities of gait and mobility: Secondary | ICD-10-CM | POA: Insufficient documentation

## 2023-10-25 DIAGNOSIS — S24104A Unspecified injury at T11-T12 level of thoracic spinal cord, initial encounter: Secondary | ICD-10-CM | POA: Diagnosis present

## 2023-10-25 DIAGNOSIS — G8221 Paraplegia, complete: Secondary | ICD-10-CM | POA: Insufficient documentation

## 2023-10-25 DIAGNOSIS — M6281 Muscle weakness (generalized): Secondary | ICD-10-CM | POA: Insufficient documentation
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
## 2023-10-27 ENCOUNTER — Ambulatory Visit: Payer: Medicaid Other

## 2023-11-01 ENCOUNTER — Ambulatory Visit: Payer: Medicaid Other | Admitting: Physical Therapy

## 2023-11-01 DIAGNOSIS — R2689 Other abnormalities of gait and mobility: Secondary | ICD-10-CM | POA: Diagnosis not present

## 2023-11-01 DIAGNOSIS — G8221 Paraplegia, complete: Secondary | ICD-10-CM

## 2023-11-01 DIAGNOSIS — S24104A Unspecified injury at T11-T12 level of thoracic spinal cord, initial encounter: Secondary | ICD-10-CM

## 2023-11-01 DIAGNOSIS — M6281 Muscle weakness (generalized): Secondary | ICD-10-CM
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
## 2023-11-02 ENCOUNTER — Ambulatory Visit (INDEPENDENT_AMBULATORY_CARE_PROVIDER_SITE_OTHER): Payer: Medicaid Other | Admitting: Physician Assistant

## 2023-11-02 DIAGNOSIS — R339 Retention of urine, unspecified: Secondary | ICD-10-CM | POA: Diagnosis not present

## 2023-11-02 DIAGNOSIS — N319 Neuromuscular dysfunction of bladder, unspecified: Secondary | ICD-10-CM

## 2023-11-02 NOTE — Progress Notes (Signed)
Cath Change/ Replacement  Patient is present today for a catheter change due to urinary retention.  8ml of water was removed from the balloon, a 16FR coude foley cath was removed without difficulty; crystalization noted at the catheter tip.  Patient was cleaned and prepped in a sterile fashion with betadine and 2% lidocaine jelly was instilled into the urethra. A 16 FR coude foley cath was replaced into the bladder, no complications were noted. Urine return was noted 5ml and urine was pink tinged in color. The balloon was filled with 10ml of sterile water. A night bag was attached for drainage.  Patient tolerated well.    Performed by: Carman Ching, PA-C   Additional notes: Foley changed in the ED 10 days ago. They report ED staff told them never to irrigate his catheter. I told them that as the urology specialist managing his catheter and with his chronic encrustation issue, he should do vinegar bladder instillations as previously instructed on several occasions.  Follow up: Return in about 3 weeks (around 11/23/2023) for Catheter exchange.

## 2023-11-03 ENCOUNTER — Ambulatory Visit: Payer: Medicaid Other | Admitting: Physical Therapy

## 2023-11-05 ENCOUNTER — Ambulatory Visit: Payer: Medicaid Other | Admitting: Physical Therapy

## 2023-11-08 ENCOUNTER — Ambulatory Visit: Payer: Medicaid Other

## 2023-11-08 DIAGNOSIS — R2689 Other abnormalities of gait and mobility: Secondary | ICD-10-CM

## 2023-11-08 DIAGNOSIS — G8221 Paraplegia, complete: Secondary | ICD-10-CM

## 2023-11-08 DIAGNOSIS — M6281 Muscle weakness (generalized): Secondary | ICD-10-CM
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
## 2023-11-10 ENCOUNTER — Ambulatory Visit: Payer: Medicaid Other
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
## 2023-11-15 ENCOUNTER — Ambulatory Visit: Payer: Medicaid Other
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
## 2023-11-17 ENCOUNTER — Ambulatory Visit: Payer: Medicaid Other

## 2023-11-17 ENCOUNTER — Ambulatory Visit (INDEPENDENT_AMBULATORY_CARE_PROVIDER_SITE_OTHER): Payer: Medicaid Other | Admitting: Urology

## 2023-11-17 ENCOUNTER — Encounter: Payer: Self-pay | Admitting: Urology

## 2023-11-17 VITALS — BP 105/69 | HR 90

## 2023-11-17 DIAGNOSIS — R2689 Other abnormalities of gait and mobility: Secondary | ICD-10-CM | POA: Diagnosis not present

## 2023-11-17 DIAGNOSIS — G8221 Paraplegia, complete: Secondary | ICD-10-CM

## 2023-11-17 DIAGNOSIS — T839XXA Unspecified complication of genitourinary prosthetic device, implant and graft, initial encounter: Secondary | ICD-10-CM

## 2023-11-17 DIAGNOSIS — T839XXS Unspecified complication of genitourinary prosthetic device, implant and graft, sequela: Secondary | ICD-10-CM

## 2023-11-17 DIAGNOSIS — M6281 Muscle weakness (generalized): Secondary | ICD-10-CM

## 2023-11-18 ENCOUNTER — Ambulatory Visit: Payer: Medicaid Other | Admitting: Physical Therapy

## 2023-11-18 DIAGNOSIS — S24104A Unspecified injury at T11-T12 level of thoracic spinal cord, initial encounter: Secondary | ICD-10-CM

## 2023-11-18 DIAGNOSIS — R2689 Other abnormalities of gait and mobility: Secondary | ICD-10-CM | POA: Diagnosis not present

## 2023-11-18 DIAGNOSIS — M6281 Muscle weakness (generalized): Secondary | ICD-10-CM

## 2023-11-18 DIAGNOSIS — G8221 Paraplegia, complete: Secondary | ICD-10-CM
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
## 2023-11-22 ENCOUNTER — Other Ambulatory Visit: Payer: Self-pay

## 2023-11-22 ENCOUNTER — Ambulatory Visit: Payer: Medicaid Other

## 2023-11-22 ENCOUNTER — Emergency Department
Admission: EM | Admit: 2023-11-22 | Discharge: 2023-11-22 | Discharge status: Against Medical Advice | Attending: Emergency Medicine | Admitting: Emergency Medicine

## 2023-11-22 ENCOUNTER — Telehealth: Payer: Self-pay

## 2023-11-22 ENCOUNTER — Telehealth: Payer: Self-pay | Admitting: Physician Assistant

## 2023-11-22 DIAGNOSIS — R339 Retention of urine, unspecified: Secondary | ICD-10-CM | POA: Insufficient documentation

## 2023-11-22 DIAGNOSIS — Z5321 Procedure and treatment not carried out due to patient leaving prior to being seen by health care provider: Secondary | ICD-10-CM | POA: Diagnosis not present
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
## 2023-11-22 NOTE — Telephone Encounter (Signed)
Error

## 2023-11-22 NOTE — ED Triage Notes (Signed)
 Patient states foley not draining since last night.

## 2023-11-22 NOTE — Telephone Encounter (Signed)
 Pt, Mom, and Brien Mates (aunt) all aware.

## 2023-11-22 NOTE — ED Notes (Signed)
 No answer when called several times from lobby

## 2023-11-22 NOTE — ED Provider Notes (Signed)
 I did not get an opportunity to evaluate patient at any point.  Signed up for patient but he was not at room.  I suspect he left from the lobby.   Christen Bame, PA-C 11/22/23 2052    Dionne Bucy, MD 11/22/23 [phone]

## 2023-11-22 NOTE — Telephone Encounter (Signed)
 Pt called triage line stating his cath is "slimey", his bladder feels hard and hurts, states he isn't draining as much, noticed blood in cath bag yesterday. Pt's mom states if we would ask Sam about a drink that could help the pt.  Sam states pt will have to go to the ER, due to schedule being at full capacity today. Sam states pt needs to be drinking 80-100 oz of liquid. And to continue to flush catheter.  Message sent to CM to contact pt.

## 2023-11-23 ENCOUNTER — Emergency Department
Admission: EM | Admit: 2023-11-23 | Discharge: 2023-11-23 | Disposition: A | Discharge status: Home / Self Care | Attending: Student in an Organized Health Care Education/Training Program | Admitting: Student in an Organized Health Care Education/Training Program

## 2023-11-23 ENCOUNTER — Ambulatory Visit: Payer: Medicaid Other | Admitting: Physician Assistant

## 2023-11-23 DIAGNOSIS — Y732 Prosthetic and other implants, materials and accessory gastroenterology and urology devices associated with adverse incidents: Secondary | ICD-10-CM | POA: Diagnosis not present

## 2023-11-23 DIAGNOSIS — T839XXA Unspecified complication of genitourinary prosthetic device, implant and graft, initial encounter: Secondary | ICD-10-CM

## 2023-11-23 DIAGNOSIS — T83091A Other mechanical complication of indwelling urethral catheter, initial encounter: Secondary | ICD-10-CM | POA: Insufficient documentation

## 2023-11-23 DIAGNOSIS — N390 Urinary tract infection, site not specified: Secondary | ICD-10-CM | POA: Diagnosis not present

## 2023-11-23 HISTORY — DX: Unspecified injury at T7-t10 level of thoracic spinal cord, initial encounter: S24.103A

## 2023-11-23 LAB — URINALYSIS, ROUTINE W REFLEX MICROSCOPIC
Bilirubin Urine: NEGATIVE
Glucose, UA: NEGATIVE mg/dL
Ketones, ur: NEGATIVE mg/dL
Nitrite: NEGATIVE
Protein, ur: >=300 mg/dL — AB
RBC / HPF: >50 RBC/hpf (ref 0–5)
Specific Gravity, Urine: 1.017 (ref 1.005–1.030)
Squamous Epithelial / HPF: 0 /HPF (ref 0–5)
WBC, UA: >50 WBC/hpf (ref 0–5)
pH: 8 (ref 5.0–8.0)

## 2023-11-23 LAB — CHLAMYDIA/NGC RT PCR (ARMC ONLY)
Chlamydia Tr: NOT DETECTED
N gonorrhoeae: NOT DETECTED

## 2023-11-23 MED ORDER — CIPROFLOXACIN HCL 500 MG PO TABS: 500.0000 mg | ORAL_TABLET | Freq: Two times a day (BID) | ORAL | 0 refills | Status: AC

## 2023-11-23 MED ORDER — LEVOFLOXACIN 750 MG PO TABS: 750.0000 mg | ORAL_TABLET | Freq: Once | ORAL | Status: DC

## 2023-11-23 MED ORDER — CIPROFLOXACIN HCL 500 MG PO TABS
500.0000 mg | ORAL_TABLET | Freq: Once | ORAL | Status: AC
  Administered 2023-11-23: 500 mg via ORAL
  Filled 2023-11-23: qty 1

## 2023-11-23 MED ORDER — NITROFURANTOIN MONOHYD MACRO 100 MG PO CAPS: 100.0000 mg | ORAL_CAPSULE | Freq: Two times a day (BID) | ORAL | 0 refills | Status: AC

## 2023-11-23 MED ORDER — NITROFURANTOIN MONOHYD MACRO 100 MG PO CAPS
100.0000 mg | ORAL_CAPSULE | Freq: Once | ORAL | Status: AC
  Administered 2023-11-23: 100 mg via ORAL
  Filled 2023-11-23: qty 1

## 2023-11-23 NOTE — ED Notes (Signed)
Called equipment for urology cart

## 2023-11-23 NOTE — Discharge Instructions (Signed)
 You are being treated for a catheter associated UTI.  Be sure to increase your catheter hygiene care at home.  Take the 2 antibiotics as directed.  Follow-up with urology for ongoing evaluation and management.  Culture results are pending at this time.  You will be notified by telephone, if different or additional antibiotic treatment is required.
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
## 2023-11-23 NOTE — ED Triage Notes (Signed)
 Pt to ED for foley catheter not draining since yesterday. Came last night, LWBS, went home and flushed it, it drained again but then stopped again this AM. Pt states he thinks it is obstructed by sediment--he saw "chunks" come out last night after flushing.. States having bladder pain. States foley was last changed about 1 week ago. Bladder scan shows . Pt thinks foley should be changed. Pt is wheelchair bound.

## 2023-11-24 ENCOUNTER — Ambulatory Visit: Admitting: Physician Assistant

## 2023-11-24 ENCOUNTER — Ambulatory Visit: Payer: Medicaid Other

## 2023-11-25 ENCOUNTER — Ambulatory Visit: Attending: Nurse Practitioner | Admitting: Physical Therapy

## 2023-11-25 DIAGNOSIS — S24104A Unspecified injury at T11-T12 level of thoracic spinal cord, initial encounter: Secondary | ICD-10-CM

## 2023-11-25 DIAGNOSIS — M6281 Muscle weakness (generalized): Secondary | ICD-10-CM | POA: Diagnosis present

## 2023-11-25 DIAGNOSIS — R2689 Other abnormalities of gait and mobility: Secondary | ICD-10-CM

## 2023-11-25 DIAGNOSIS — G8221 Paraplegia, complete: Secondary | ICD-10-CM | POA: Diagnosis present
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
## 2023-11-26 LAB — URINE CULTURE
Culture: >=100000 — AB
Special Requests: NORMAL

## 2023-11-29 ENCOUNTER — Telehealth: Payer: Self-pay | Admitting: Physical Medicine and Rehabilitation

## 2023-11-29 ENCOUNTER — Ambulatory Visit: Payer: Medicaid Other

## 2023-11-29 DIAGNOSIS — R2689 Other abnormalities of gait and mobility: Secondary | ICD-10-CM

## 2023-11-29 DIAGNOSIS — G8221 Paraplegia, complete: Secondary | ICD-10-CM

## 2023-11-29 DIAGNOSIS — M6281 Muscle weakness (generalized): Secondary | ICD-10-CM

## 2023-11-29 NOTE — Telephone Encounter (Signed)
 LVM for mother to call us back letting us know exactly what it was she needed, not sure if it's a letter or paperwork that needs to be filled out on patient.  She said it was documentation on mobility.

## 2023-11-30 NOTE — Telephone Encounter (Signed)
 Lee [name] has a court date coming soon. His attorney has asked for a updated letter.  To contain his current medical condition. Please include what he can and cannot do.

## 2023-12-01 ENCOUNTER — Ambulatory Visit: Payer: Medicaid Other

## 2023-12-01 ENCOUNTER — Ambulatory Visit

## 2023-12-01 NOTE — Telephone Encounter (Signed)
 I spoke with his Mother. She will get in-touch with her Sister and have her contact you.

## 2023-12-01 NOTE — Telephone Encounter (Signed)
 Attempted to call his Mother back. No answer. I left a general message for her to call back.

## 2023-12-03 ENCOUNTER — Other Ambulatory Visit: Payer: Self-pay

## 2023-12-03 ENCOUNTER — Telehealth: Payer: Self-pay

## 2023-12-03 NOTE — Telephone Encounter (Signed)
 Copied from CRM [id number]. Topic: Medical Record Request - Attorney/Litigation >> Dec 03, 2023  3:27 PM Ivette P wrote: Reason for CRM: Pt Aunt Glennon Hamilton called in to request a Letter from the primary care doctor about pt's current condition so it can be provided to the lawyer.   Deirdre Priest [phone]  Asked if letter can be emailed at [email or web address]  If not emailed. Please call Glennon Hamilton to notify when to pick up  Route to Research officer, political party. >> Dec 03, 2023  3:30 PM Ivette P wrote: Pt does need Letter by Monday 03/17

## 2023-12-06 ENCOUNTER — Ambulatory Visit: Payer: Medicaid Other

## 2023-12-06 ENCOUNTER — Encounter: Payer: Self-pay | Admitting: Physical Medicine and Rehabilitation

## 2023-12-06 NOTE — Telephone Encounter (Signed)
 The letter needs to state patient's current condition and its for his attorney [place].
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
## 2023-12-06 NOTE — Telephone Encounter (Signed)
 Done

## 2023-12-06 NOTE — Telephone Encounter (Signed)
 Lee [name] has been informed. Letter is completed and is at the front desk for pickup.

## 2023-12-08 ENCOUNTER — Ambulatory Visit
Admission: RE | Admit: 2023-12-08 | Discharge: 2023-12-08 | Disposition: A | Discharge status: Home / Self Care | Source: Ambulatory Visit | Attending: Physician Assistant

## 2023-12-08 ENCOUNTER — Other Ambulatory Visit: Payer: Self-pay | Admitting: Physician Assistant

## 2023-12-08 ENCOUNTER — Ambulatory Visit (INDEPENDENT_AMBULATORY_CARE_PROVIDER_SITE_OTHER): Payer: Medicaid Other | Admitting: Physician Assistant

## 2023-12-08 ENCOUNTER — Ambulatory Visit: Payer: Medicaid Other

## 2023-12-08 ENCOUNTER — Ambulatory Visit
Admission: RE | Admit: 2023-12-08 | Discharge: 2023-12-08 | Disposition: A | Discharge status: Home / Self Care | Source: Ambulatory Visit | Attending: Physician Assistant | Admitting: Physician Assistant

## 2023-12-08 ENCOUNTER — Encounter: Payer: Self-pay | Admitting: Physician Assistant

## 2023-12-08 VITALS — BP 111/75 | HR 77

## 2023-12-08 DIAGNOSIS — Z466 Encounter for fitting and adjustment of urinary device: Secondary | ICD-10-CM | POA: Diagnosis not present

## 2023-12-08 DIAGNOSIS — R8290 Unspecified abnormal findings in urine: Secondary | ICD-10-CM | POA: Diagnosis present

## 2023-12-08 DIAGNOSIS — N319 Neuromuscular dysfunction of bladder, unspecified: Secondary | ICD-10-CM

## 2023-12-08 NOTE — Progress Notes (Signed)
 Cath Change/ Replacement  Patient is present today for a catheter change due to urinary retention.  8ml of water was removed from the balloon, a 16FR coude foley cath was removed without difficulty.  Patient was cleaned and prepped in a sterile fashion with betadine and 2% lidocaine jelly was instilled into the urethra. A 16 FR coude foley cath was replaced into the bladder, no complications were noted. Urine return was noted 20ml and urine was yellow in color. The balloon was filled with 10ml of sterile water. A night bag was attached for drainage.  Patient tolerated well.    Performed by: Carman Ching, PA-C   Additional notes: KUB today with an indeterminate radiopaque density in the pelvis. He was not wearing the metal components of his back brace during the x-ray and had taken off his hoodie. He denies rectal foreign bodies. Question possible bladder stone, however the contour is rather unusual. With his frequent, recurrent encrustation issues and daily Topamax use, will obtain CT stone study for further evaluation. He is in agreement with this plan.  Follow up: Return in about 3 weeks (around 12/29/2023) for Catheter change and CT results.

## 2023-12-09 ENCOUNTER — Ambulatory Visit

## 2023-12-09 DIAGNOSIS — R2689 Other abnormalities of gait and mobility: Secondary | ICD-10-CM | POA: Diagnosis not present

## 2023-12-09 DIAGNOSIS — M6281 Muscle weakness (generalized): Secondary | ICD-10-CM

## 2023-12-09 DIAGNOSIS — G8221 Paraplegia, complete: Secondary | ICD-10-CM
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
## 2023-12-13 ENCOUNTER — Ambulatory Visit: Payer: Medicaid Other

## 2023-12-14 ENCOUNTER — Ambulatory Visit

## 2023-12-15 ENCOUNTER — Ambulatory Visit: Payer: Medicaid Other

## 2023-12-15 DIAGNOSIS — R2689 Other abnormalities of gait and mobility: Secondary | ICD-10-CM

## 2023-12-15 DIAGNOSIS — M6281 Muscle weakness (generalized): Secondary | ICD-10-CM

## 2023-12-15 DIAGNOSIS — G8221 Paraplegia, complete: Secondary | ICD-10-CM
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
## 2023-12-16 ENCOUNTER — Ambulatory Visit: Admitting: Physical Therapy

## 2023-12-16 DIAGNOSIS — M6281 Muscle weakness (generalized): Secondary | ICD-10-CM

## 2023-12-16 DIAGNOSIS — R2689 Other abnormalities of gait and mobility: Secondary | ICD-10-CM | POA: Diagnosis not present

## 2023-12-16 DIAGNOSIS — S24104A Unspecified injury at T11-T12 level of thoracic spinal cord, initial encounter: Secondary | ICD-10-CM

## 2023-12-16 DIAGNOSIS — G8221 Paraplegia, complete: Secondary | ICD-10-CM
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
## 2023-12-17 ENCOUNTER — Ambulatory Visit
Admission: RE | Admit: 2023-12-17 | Discharge: 2023-12-17 | Disposition: A | Discharge status: Home / Self Care | Source: Ambulatory Visit | Attending: Physician Assistant | Admitting: Physician Assistant

## 2023-12-17 DIAGNOSIS — N319 Neuromuscular dysfunction of bladder, unspecified: Secondary | ICD-10-CM | POA: Diagnosis present

## 2023-12-20 ENCOUNTER — Ambulatory Visit: Payer: Medicaid Other

## 2023-12-20 DIAGNOSIS — M6281 Muscle weakness (generalized): Secondary | ICD-10-CM

## 2023-12-20 DIAGNOSIS — G8221 Paraplegia, complete: Secondary | ICD-10-CM

## 2023-12-20 DIAGNOSIS — S24104A Unspecified injury at T11-T12 level of thoracic spinal cord, initial encounter: Secondary | ICD-10-CM

## 2023-12-20 DIAGNOSIS — R2689 Other abnormalities of gait and mobility: Secondary | ICD-10-CM
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
## 2023-12-22 ENCOUNTER — Telehealth: Payer: Self-pay

## 2023-12-22 ENCOUNTER — Ambulatory Visit: Payer: Medicaid Other | Attending: Nurse Practitioner

## 2023-12-22 DIAGNOSIS — G8221 Paraplegia, complete: Secondary | ICD-10-CM | POA: Insufficient documentation

## 2023-12-22 DIAGNOSIS — M6281 Muscle weakness (generalized): Secondary | ICD-10-CM | POA: Insufficient documentation

## 2023-12-22 DIAGNOSIS — Z466 Encounter for fitting and adjustment of urinary device: Secondary | ICD-10-CM | POA: Insufficient documentation

## 2023-12-22 DIAGNOSIS — R2689 Other abnormalities of gait and mobility: Secondary | ICD-10-CM | POA: Insufficient documentation

## 2023-12-22 NOTE — Telephone Encounter (Signed)
 Patient called due to no show. Sent to voicemail, with information given of next appointment time and date.   Precious Bard, PT, DPT Physical Therapist - [place] [facility]  Outpatient Physical Therapy- Main Campus [phone]

## 2023-12-24 ENCOUNTER — Encounter: Payer: Self-pay | Admitting: Physician Assistant

## 2023-12-24 ENCOUNTER — Ambulatory Visit (INDEPENDENT_AMBULATORY_CARE_PROVIDER_SITE_OTHER): Admitting: Physician Assistant

## 2023-12-24 VITALS — Ht 67.0 in | Wt 144.8 lb

## 2023-12-24 DIAGNOSIS — N210 Calculus in bladder: Secondary | ICD-10-CM | POA: Diagnosis not present

## 2023-12-24 DIAGNOSIS — Z466 Encounter for fitting and adjustment of urinary device: Secondary | ICD-10-CM

## 2023-12-24 DIAGNOSIS — K592 Neurogenic bowel, not elsewhere classified: Secondary | ICD-10-CM

## 2023-12-24 NOTE — Patient Instructions (Addendum)
[facility] [place]  [phone] 
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
## 2023-12-27 ENCOUNTER — Telehealth: Payer: Self-pay

## 2023-12-27 ENCOUNTER — Other Ambulatory Visit: Payer: Self-pay

## 2023-12-27 ENCOUNTER — Ambulatory Visit: Payer: Medicaid Other

## 2023-12-27 ENCOUNTER — Ambulatory Visit: Admitting: Physician Assistant

## 2023-12-27 DIAGNOSIS — N210 Calculus in bladder: Secondary | ICD-10-CM

## 2023-12-27 NOTE — Telephone Encounter (Signed)
 Per Dr. Richardo Hanks, Patient is to be scheduled for Cystolithlopaxy   Mr. Pitsenbarger was contacted and possible surgical dates were discussed, Friday April 11th, 2025 was agreed upon for surgery.   Patient was directed to call [phone] between 1-3pm the day before surgery to find out surgical arrival time.  Instructions were given not to eat or drink from midnight on the night before surgery and have a driver for the day of surgery. On the surgery day patient was instructed to enter through the [facility] entrance of [facility] report the Same Day Surgery desk.   Pre-Admit Testing will be in contact via phone to set up an interview with the anesthesia team to review your history and medications prior to surgery.   Reminder of this information was sent via MyChart to the patient.

## 2023-12-27 NOTE — Progress Notes (Signed)
 Surgical Physician Order Form [facility] [place]  Dr. Richardo Hanks * Scheduling expectation : Next Available  *Length of Case:   *Clearance needed: no  *Anticoagulation Instructions: N/A  *Aspirin Instructions: N/A  *Post-op visit Date/Instructions:  1 month void trial  *Diagnosis: Bladder Stone  *Procedure:  Cystolitholapaxy >2.5cm (16109)   Additional orders: N/A  -Admit type: OUTpatient  -Anesthesia: General  -VTE Prophylaxis Standing Order SCD's       Other:   -Standing Lab Orders Per Anesthesia    Lab other: None  -Standing Test orders EKG/Chest x-ray per Anesthesia       Test other:   - Medications:  TBD per preop ucx  -Other orders:  N/A

## 2023-12-27 NOTE — Telephone Encounter (Signed)
 Patient called due to second no show in a row. Patient given time and date of next appointment, verbalized understanding.   Precious Bard, PT, DPT Physical Therapist - [place] [facility]  Outpatient Physical Therapy- Main Campus [phone]

## 2023-12-27 NOTE — Progress Notes (Signed)
   [facility] Surgical Posting Form  Surgery Date: Date: 12/31/2023  Surgeon: Dr. Legrand Rams, MD  Inpt ( No  )   Outpt (Yes)   Obs ( No  )   Diagnosis: N21.0 Bladder Stone  -CPT: [phone]  Surgery: Cystolithlopaxy  Stop Anticoagulations: No  Cardiac/Medical/Pulmonary Clearance needed: no   *Orders entered into EPIC  Date: 12/27/23   *Case booked in [place]  Date: 12/27/23  *Notified pt of Surgery: Date: 12/27/23  PRE-OP UA & CX: no  *Placed into Prior Authorization Work [place] Date: 12/27/23  Assistant/laser/rep:No

## 2023-12-29 ENCOUNTER — Ambulatory Visit: Payer: Medicaid Other

## 2023-12-29 ENCOUNTER — Telehealth: Payer: Self-pay

## 2023-12-29 ENCOUNTER — Encounter
Admission: RE | Admit: 2023-12-29 | Discharge: 2023-12-29 | Disposition: A | Discharge status: Home / Self Care | Source: Ambulatory Visit | Attending: Urology | Admitting: Urology

## 2023-12-29 ENCOUNTER — Other Ambulatory Visit: Payer: Self-pay

## 2023-12-29 DIAGNOSIS — R2689 Other abnormalities of gait and mobility: Secondary | ICD-10-CM | POA: Diagnosis present

## 2023-12-29 DIAGNOSIS — M6281 Muscle weakness (generalized): Secondary | ICD-10-CM

## 2023-12-29 DIAGNOSIS — G8221 Paraplegia, complete: Secondary | ICD-10-CM | POA: Diagnosis present

## 2023-12-29 DIAGNOSIS — Z466 Encounter for fitting and adjustment of urinary device: Secondary | ICD-10-CM | POA: Diagnosis not present

## 2023-12-29 HISTORY — DX: Depression, unspecified: F32.A

## 2023-12-29 HISTORY — DX: Headache, unspecified: R51.9

## 2023-12-29 HISTORY — DX: Anxiety disorder, unspecified: F41.9

## 2023-12-29 HISTORY — DX: Personal history of urinary calculi: Z87.442

## 2023-12-29 LAB — CULTURE, URINE COMPREHENSIVE

## 2023-12-29 MED ORDER — AMOXICILLIN-POT CLAVULANATE 875-125 MG PO TABS: 1.0000 | ORAL_TABLET | Freq: Two times a day (BID) | ORAL | 0 refills | Status: AC

## 2023-12-29 NOTE — Patient Instructions (Addendum)
 Your procedure is scheduled on: 12/31/23 - Friday Report to the Registration Desk on the 1st floor of the [facility]. To find out your arrival time, please call [phone] between 1PM - 3PM on: 12/30/23 - Thursday If your arrival time is 6:00 am, do not arrive before that time as the [facility] entrance doors do not open until 6:00 am.  REMEMBER: Instructions that are not followed completely may result in serious medical risk, up to and including death; or upon the discretion of your surgeon and anesthesiologist your surgery may need to be rescheduled.  Do not eat food or drinking any liquids after midnight the night before surgery.  No gum chewing or hard candies.   One week prior to surgery: Stop Anti-inflammatories (NSAIDS) such as Advil, Aleve, Ibuprofen, Motrin, Naproxen, Naprosyn and Aspirin based products such as Excedrin, Goody's Powder, BC Powder. You may take Tylenol if needed for pain up until the day of surgery.  Stop ANY OVER THE COUNTER supplements until after surgery.   ON THE DAY OF SURGERY ONLY TAKE THESE MEDICATIONS WITH SIPS OF WATER:  baclofen (LIORESAL)  gabapentin (NEURONTIN)  oxyCODONE (ROXICODONE) if needed   No Alcohol for 24 hours before or after surgery.  No Smoking including e-cigarettes for 24 hours before surgery.  No chewable tobacco products for at least 6 hours before surgery.  No nicotine patches on the day of surgery.  Do not use any "recreational" drugs for at least a week (preferably 2 weeks) before your surgery.  Please be advised that the combination of cocaine and anesthesia may have negative outcomes, up to and including death. If you test positive for cocaine, your surgery will be cancelled.  On the morning of surgery brush your teeth with toothpaste and water, you may rinse your mouth with mouthwash if you wish. Do not swallow any toothpaste or mouthwash.  Use CHG Soap or wipes as directed on instruction sheet.  Do not wear  jewelry, make-up, hairpins, clips or nail polish.  For welded (permanent) jewelry: bracelets, anklets, waist bands, etc.  Please have this removed prior to surgery.  If it is not removed, there is a chance that hospital personnel will need to cut it off on the day of surgery.  Do not wear lotions, powders, or perfumes.   Do not shave body hair from the neck down 48 hours before surgery.  Contact lenses, hearing aids and dentures may not be worn into surgery.  Do not bring valuables to the hospital. [facility] is not responsible for any missing/lost belongings or valuables.   Notify your doctor if there is any change in your medical condition (cold, fever, infection).  Wear comfortable clothing (specific to your surgery type) to the hospital.  After surgery, you can help prevent lung complications by doing breathing exercises.  Take deep breaths and cough every 1-2 hours. Your doctor may order a device called an Incentive Spirometer to help you take deep breaths.  When coughing or sneezing, hold a pillow firmly against your incision with both hands. This is called "splinting." Doing this helps protect your incision. It also decreases belly discomfort.  If you are being admitted to the hospital overnight, leave your suitcase in the car. After surgery it may be brought to your room.  In case of increased patient census, it may be necessary for you, the patient, to continue your postoperative care in the Same Day Surgery department.  If you are being discharged the day of surgery, you  will not be allowed to drive home. You will need a responsible individual to drive you home and stay with you for 24 hours after surgery.   If you are taking public transportation, you will need to have a responsible individual with you.  Please call the Pre-admissions Testing Dept. at [phone] if you have any questions about these instructions.  Surgery Visitation Policy:  Patients having surgery  or a procedure may have two visitors.  Children under the age of 19 must have an adult with them who is not the patient.  Inpatient Visitation:    Visiting hours are 7 a.m. to 8 p.m. Up to four visitors are allowed at one time in a patient room. The visitors may rotate out with other people during the day.  One visitor age 27 or older may stay with the patient overnight and must be in the room by 8 p.m.

## 2023-12-29 NOTE — Telephone Encounter (Signed)
 Augmentin 875-125mg  BID x7 days to cover urine per Hilton Sinclair PA
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
## 2023-12-30 ENCOUNTER — Emergency Department

## 2023-12-30 ENCOUNTER — Encounter: Payer: Self-pay | Admitting: Intensive Care

## 2023-12-30 ENCOUNTER — Emergency Department
Admission: EM | Admit: 2023-12-30 | Discharge: 2023-12-30 | Disposition: A | Discharge status: Home / Self Care | Attending: Emergency Medicine | Admitting: Emergency Medicine

## 2023-12-30 ENCOUNTER — Other Ambulatory Visit: Payer: Self-pay

## 2023-12-30 DIAGNOSIS — T83511A Infection and inflammatory reaction due to indwelling urethral catheter, initial encounter: Secondary | ICD-10-CM | POA: Insufficient documentation

## 2023-12-30 DIAGNOSIS — Z978 Presence of other specified devices: Secondary | ICD-10-CM

## 2023-12-30 DIAGNOSIS — N390 Urinary tract infection, site not specified: Secondary | ICD-10-CM | POA: Diagnosis not present

## 2023-12-30 DIAGNOSIS — R1030 Lower abdominal pain, unspecified: Secondary | ICD-10-CM | POA: Diagnosis present

## 2023-12-30 DIAGNOSIS — N210 Calculus in bladder: Secondary | ICD-10-CM | POA: Insufficient documentation

## 2023-12-30 DIAGNOSIS — K5900 Constipation, unspecified: Secondary | ICD-10-CM | POA: Diagnosis not present

## 2023-12-30 DIAGNOSIS — R102 Pelvic and perineal pain: Secondary | ICD-10-CM

## 2023-12-30 LAB — URINALYSIS, ROUTINE W REFLEX MICROSCOPIC
Bacteria, UA: NONE SEEN
Bilirubin Urine: NEGATIVE
Glucose, UA: NEGATIVE mg/dL
Ketones, ur: NEGATIVE mg/dL
Nitrite: POSITIVE — AB
Protein, ur: 100 mg/dL — AB
Specific Gravity, Urine: 1.017 (ref 1.005–1.030)
WBC, UA: >50 WBC/hpf (ref 0–5)
pH: 6 (ref 5.0–8.0)

## 2023-12-30 LAB — COMPREHENSIVE METABOLIC PANEL WITH GFR
ALT: 13 U/L (ref 0–44)
AST: 17 U/L (ref 15–41)
Albumin: 4.1 g/dL (ref 3.5–5.0)
Alkaline Phosphatase: 46 U/L (ref 38–126)
Anion gap: 8 (ref 5–15)
BUN: 10 mg/dL (ref 6–20)
CO2: 27 mmol/L (ref 22–32)
Calcium: 9.6 mg/dL (ref 8.9–10.3)
Chloride: 105 mmol/L (ref 98–111)
Creatinine, Ser: 0.64 mg/dL (ref 0.61–1.24)
GFR, Estimated: >60 mL/min (ref >60)
Glucose, Bld: 100 mg/dL — ABNORMAL HIGH (ref 70–99)
Potassium: 3.9 mmol/L (ref 3.5–5.1)
Sodium: 140 mmol/L (ref 135–145)
Total Bilirubin: 1 mg/dL (ref 0.0–1.2)
Total Protein: 7.6 g/dL (ref 6.5–8.1)

## 2023-12-30 LAB — CBC
HCT: 47.7 % (ref 39.0–52.0)
Hemoglobin: 14.8 g/dL (ref 13.0–17.0)
MCH: 27.6 pg (ref 26.0–34.0)
MCHC: 31 g/dL (ref 30.0–36.0)
MCV: 88.8 fL (ref 80.0–100.0)
Platelets: 181 10*3/uL (ref 150–400)
RBC: 5.37 MIL/uL (ref 4.22–5.81)
RDW: 13.3 % (ref 11.5–15.5)
WBC: 6.4 10*3/uL (ref 4.0–10.5)
nRBC: 0 % (ref 0.0–0.2)

## 2023-12-30 LAB — LIPASE, BLOOD: Lipase: 26 U/L (ref 11–51)

## 2023-12-30 MED ORDER — CEFAZOLIN SODIUM-DEXTROSE 2-4 GM/100ML-% IV SOLN: 2.0000 g | INTRAVENOUS | Status: DC

## 2023-12-30 MED ORDER — LACTATED RINGERS IV BOLUS: 1000.0000 mL | Freq: Once | INTRAVENOUS | Status: DC

## 2023-12-30 MED ORDER — ONDANSETRON 4 MG PO TBDP
4.0000 mg | ORAL_TABLET | Freq: Once | ORAL | Status: AC
  Administered 2023-12-30: 4 mg via ORAL
  Filled 2023-12-30: qty 1

## 2023-12-30 MED ORDER — CHLORHEXIDINE GLUCONATE 0.12 % MT SOLN: 15.0000 mL | Freq: Once | OROMUCOSAL | Status: DC

## 2023-12-30 MED ORDER — ONDANSETRON 4 MG PO TBDP: 4.0000 mg | ORAL_TABLET | Freq: Once | ORAL | Status: DC

## 2023-12-30 MED ORDER — POLYETHYLENE GLYCOL 3350 17 G PO PACK: 17.0000 g | PACK | Freq: Every day | ORAL | 0 refills | Status: AC

## 2023-12-30 MED ORDER — ORAL CARE MOUTH RINSE: 15.0000 mL | Freq: Once | OROMUCOSAL | Status: DC

## 2023-12-30 MED ORDER — LACTATED RINGERS IV SOLN: INTRAVENOUS | Status: DC

## 2023-12-30 MED ORDER — ONDANSETRON HCL 4 MG/2ML IJ SOLN: 4.0000 mg | Freq: Once | INTRAMUSCULAR | Status: DC

## 2023-12-30 MED ORDER — CIPROFLOXACIN HCL 500 MG PO TABS
500.0000 mg | ORAL_TABLET | Freq: Once | ORAL | Status: AC
  Administered 2023-12-30: 500 mg via ORAL
  Filled 2023-12-30: qty 1

## 2023-12-30 MED ORDER — CIPROFLOXACIN HCL 500 MG PO TABS: 500.0000 mg | ORAL_TABLET | Freq: Two times a day (BID) | ORAL | 0 refills | Status: AC
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
## 2023-12-30 NOTE — ED Provider Notes (Signed)
 [facility] Provider Note    Event Date/Time   First MD Initiated Contact with Patient 12/30/23 1538     (approximate)   History   Abdominal Pain   HPI  Lee [name] is a 18 y.o. male history of paraplegia, chronic indwelling Foley, anxiety, depression, presenting with suprapubic pain as well as left lower back aching.  He thinks that his bladder stone might be clogging up his Foley.  States 2 hours ago, his Foley bag was half filled, now he thinks that his urine is not coming out as per usual.  No fevers or chills.  Is supposed to see urology tomorrow for procedure.  States that he is supposed to be on an antibiotic for possible UTI but has not picked it up yet.  States that a bladder scan was done when he initially came in that showed that his bladder volume was 0. Patient states that he takes baclofen, gabapentin, oxycodone for pain and bladder spasms but vomited up his medications today.  On independent review, he was seen by urology on 4 April, had his catheter changed at that time, has a history of a large bladder stone that was thought to contribute to frequent catheter encrustation.  Is scheduled for cystoscopy litholapaxy with Dr. Irish Elders tomorrow.  Was started on Augmentin for UTI yesterday but he has not picked up his antibiotics yet.     Physical Exam   Triage Vital Signs: ED Triage Vitals  Encounter Vitals Group     BP 12/30/23 1359 106/89     Systolic BP Percentile --      Diastolic BP Percentile --      Pulse Rate 12/30/23 1359 88     Resp 12/30/23 1359 16     Temp 12/30/23 1359 98.4 F (36.9 C)     Temp Source 12/30/23 1359 Oral     SpO2 12/30/23 1359 97 %     Weight 12/30/23 1412 140 lb (63.5 kg)     Height 12/30/23 1412 5\' 7"  (1.702 m)     Head Circumference --      Peak Flow --      Pain Score 12/30/23 1412 10     Pain Loc --      Pain Education --      Exclude from Growth Chart --     Most recent vital signs: Vitals:    12/30/23 1359  BP: 106/89  Pulse: 88  Resp: 16  Temp: 98.4 F (36.9 C)  SpO2: 97%     General: Awake, no distress.  CV:  Good peripheral perfusion.  Resp:  Normal effort.  Abd:  No distention.  Mild suprapubic tenderness, Foley's in place with cloudy yellow urine, scant amount of urine in the bag. Other:  No CVA tenderness, no flank or back tenderness to palpation   ED Results / Procedures / Treatments   Labs (all labs ordered are listed, but only abnormal results are displayed) Labs Reviewed  COMPREHENSIVE METABOLIC PANEL WITH GFR - Abnormal; Notable for the following components:      Result Value   Glucose, Bld 100 (*)    All other components within normal limits  URINALYSIS, ROUTINE W REFLEX MICROSCOPIC - Abnormal; Notable for the following components:   Color, Urine YELLOW (*)    APPearance TURBID (*)    Hgb urine dipstick MODERATE (*)    Protein, ur 100 (*)    Nitrite POSITIVE (*)    Leukocytes,Ua MODERATE (*)  All other components within normal limits  LIPASE, BLOOD  CBC     RADIOLOGY CT imaging on my independent interpretation showed a collapsed bladder around the Foley as well as bladder stones.   PROCEDURES:  Critical Care performed: No  Procedures   MEDICATIONS ORDERED IN ED: Medications  ciprofloxacin (CIPRO) tablet 500 mg (has no administration in time range)  ondansetron (ZOFRAN-ODT) disintegrating tablet 4 mg (4 mg Oral Given 12/30/23 1634)     IMPRESSION / MDM / ASSESSMENT AND PLAN / ED COURSE  I reviewed the triage vital signs and the nursing notes.                              Differential diagnosis includes, but is not limited to, nephrolithiasis, bladder stone obstruction, dehydration, AKI, electrolyte derangements, UTI.  Will get labs, UA, CT, will give him some fluids, antiemetics.    Patient's presentation is most consistent with acute presentation with potential threat to life or bodily function.  Independent review of labs and  imaging are below.  Had offered his home pain medications but patient declined at this time.  He thinks that his Foley is clogged but repeated bladder scans are 0 and the CT imaging shows that his Foley is working since his bladder is collapsed around the Foley balloon. UA is consistent with UTI, on independent chart review of prior urine cultures, his prior cultures were susceptible to ciprofloxacin.  Will give him a dose here and send a prescription for Cipro for him.  Instructed him to not take the Augmentin since prior cultures do not appear susceptible to Augmentin.  Suspect his suprapubic pain is due to the UTI as well as bladder spasms and discomfort from the bladder stone.  Considered but no indication for inpatient admission at this time, he is safe for outpatient management.  Will discharge with strict precautions.  Shared decision making done with patient and he is agreeable plan, also discussed CT imaging findings of constipation as an incidental finding, will give him some MiraLAX for home as well.  Instructed him to go to his urology appointment tomorrow.  He is agreeable to plan.    Clinical Course as of 12/30/23 1747  Thu Dec 30, 2023  1617 Was initially going to give him some IV fluids and Zofran but he declined getting an IV placed.  Will switch over to p.o. Zofran. [TT]  1635 Repeat bladder scan is 0. [TT]  1642 Independent review of labs, lipase normal, no leukocytosis, electrolytes not severely deranged, creatinine is normal, LFTs are normal, UA is consistent with UTI.  He was given a prescription for Augmentin but has not taken it yet. [TT]  1739 CT ABDOMEN PELVIS WO CONTRAST IMPRESSION: 1. No hydronephrosis or nephrolithiasis. 2. Similar appearance of multiple curvilinear calcific densities within the urinary bladder. 3. Constipation. No bowel obstruction.   [TT]    Clinical Course User Index [TT] Claybon Jabs, MD     FINAL CLINICAL IMPRESSION(S) / ED DIAGNOSES   Final  diagnoses:  Suprapubic abdominal pain  Indwelling Foley catheter present  Bladder stone  Constipation, unspecified constipation type  Urinary tract infection without hematuria, site unspecified     Rx / DC Orders   ED Discharge Orders          Ordered    ciprofloxacin (CIPRO) 500 MG tablet  2 times daily        12/30/23 1746  polyethylene glycol (MIRALAX) 17 g packet  Daily        12/30/23 1746             Note:  This document was prepared using Dragon voice recognition software and may include unintentional dictation errors.    Claybon Jabs, MD 12/30/23 [phone]

## 2023-12-30 NOTE — ED Notes (Signed)
 See triage notes. Patient c/o abdominal pains that began this morning. Patient believes a bladder stone is clogging his catheter.

## 2023-12-30 NOTE — Discharge Instructions (Addendum)
 Please take the antibiotics as prescribed for your urinary tract infection.  Since urine cultures are susceptible to ciprofloxacin, you should take that instead of the Augmentin.  Please follow-up with urology tomorrow.

## 2023-12-31 ENCOUNTER — Ambulatory Visit: Admission: RE | Admit: 2023-12-31 | Source: Home / Self Care | Admitting: Urology

## 2023-12-31 ENCOUNTER — Telehealth: Payer: Self-pay

## 2023-12-31 ENCOUNTER — Encounter: Admission: RE | Payer: Self-pay | Source: Home / Self Care

## 2023-12-31 DIAGNOSIS — N210 Calculus in bladder: Secondary | ICD-10-CM

## 2023-12-31 SURGERY — CYSTOSCOPY, WITH BLADDER CALCULUS LITHOLAPAXY
Anesthesia: General

## 2023-12-31 NOTE — Telephone Encounter (Signed)
 Received information that patient was in the ED yesterday afternoon. Will have to move surgery 1 week from now due to not starting antibiotic. Patient upset since he is in pain. Told him it was imperative to take antibiotic for infection. Rescheduled to Friday 01/07/24 with Dr. Richardo Hanks, OR and Surgeon aware.

## 2024-01-03 ENCOUNTER — Ambulatory Visit: Payer: Medicaid Other

## 2024-01-05 ENCOUNTER — Ambulatory Visit: Payer: Medicaid Other

## 2024-01-05 DIAGNOSIS — M6281 Muscle weakness (generalized): Secondary | ICD-10-CM

## 2024-01-05 DIAGNOSIS — G8221 Paraplegia, complete: Secondary | ICD-10-CM

## 2024-01-05 DIAGNOSIS — R2689 Other abnormalities of gait and mobility: Secondary | ICD-10-CM
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
## 2024-01-07 ENCOUNTER — Ambulatory Visit

## 2024-01-07 ENCOUNTER — Encounter
Admission: RE | Disposition: A | Discharge status: Home / Self Care | Payer: Self-pay | Source: Home / Self Care | Attending: Urology

## 2024-01-07 ENCOUNTER — Other Ambulatory Visit: Payer: Self-pay

## 2024-01-07 ENCOUNTER — Encounter: Payer: Self-pay | Admitting: Urology

## 2024-01-07 ENCOUNTER — Ambulatory Visit
Admission: RE | Admit: 2024-01-07 | Discharge: 2024-01-07 | Disposition: A | Discharge status: Home / Self Care | Attending: Urology | Admitting: Urology

## 2024-01-07 DIAGNOSIS — T148XXS Other injury of unspecified body region, sequela: Secondary | ICD-10-CM | POA: Diagnosis not present

## 2024-01-07 DIAGNOSIS — N319 Neuromuscular dysfunction of bladder, unspecified: Secondary | ICD-10-CM | POA: Insufficient documentation

## 2024-01-07 DIAGNOSIS — W3400XS Accidental discharge from unspecified firearms or gun, sequela: Secondary | ICD-10-CM | POA: Diagnosis not present

## 2024-01-07 DIAGNOSIS — N210 Calculus in bladder: Secondary | ICD-10-CM | POA: Insufficient documentation

## 2024-01-07 DIAGNOSIS — Z87440 Personal history of urinary (tract) infections: Secondary | ICD-10-CM | POA: Insufficient documentation

## 2024-01-07 HISTORY — PX: CYSTOSCOPY WITH LITHOLAPAXY: SHX1425

## 2024-01-07 SURGERY — CYSTOSCOPY, WITH BLADDER CALCULUS LITHOLAPAXY
Anesthesia: General | Site: Bladder

## 2024-01-07 MED ORDER — CEFAZOLIN SODIUM-DEXTROSE 2-4 GM/100ML-% IV SOLN
INTRAVENOUS | Status: AC
  Filled 2024-01-07: qty 100

## 2024-01-07 MED ORDER — FENTANYL CITRATE (PF) 100 MCG/2ML IJ SOLN
INTRAMUSCULAR | Status: AC
  Filled 2024-01-07: qty 2

## 2024-01-07 MED ORDER — SUGAMMADEX SODIUM 200 MG/2ML IV SOLN
INTRAVENOUS | Status: DC | PRN
Start: 2024-01-07 — End: 2024-01-07
  Administered 2024-01-07: 254 mg via INTRAVENOUS

## 2024-01-07 MED ORDER — ACETAMINOPHEN 10 MG/ML IV SOLN
INTRAVENOUS | Status: AC
  Filled 2024-01-07: qty 100

## 2024-01-07 MED ORDER — PHENYLEPHRINE 80 MCG/ML (10ML) SYRINGE FOR IV PUSH (FOR BLOOD PRESSURE SUPPORT)
PREFILLED_SYRINGE | INTRAVENOUS | Status: DC | PRN
Start: 2024-01-07 — End: 2024-01-07
  Administered 2024-01-07 (×2): 80 ug via INTRAVENOUS

## 2024-01-07 MED ORDER — OXYCODONE HCL 5 MG PO TABS
5.0000 mg | ORAL_TABLET | Freq: Once | ORAL | Status: AC | PRN
  Administered 2024-01-07: 5 mg via ORAL

## 2024-01-07 MED ORDER — DEXAMETHASONE SODIUM PHOSPHATE 10 MG/ML IJ SOLN
INTRAMUSCULAR | Status: AC
  Filled 2024-01-07: qty 1

## 2024-01-07 MED ORDER — DEXMEDETOMIDINE HCL IN NACL 80 MCG/20ML IV SOLN
INTRAVENOUS | Status: DC | PRN
  Administered 2024-01-07: 8 ug via INTRAVENOUS
  Administered 2024-01-07: 4 ug via INTRAVENOUS

## 2024-01-07 MED ORDER — DEXAMETHASONE SODIUM PHOSPHATE 10 MG/ML IJ SOLN
INTRAMUSCULAR | Status: DC | PRN
  Administered 2024-01-07: 10 mg via INTRAVENOUS

## 2024-01-07 MED ORDER — GLYCOPYRROLATE 0.2 MG/ML IJ SOLN
INTRAMUSCULAR | Status: DC | PRN
  Administered 2024-01-07: .1 mg via INTRAVENOUS
  Administered 2024-01-07: .2 mg via INTRAVENOUS

## 2024-01-07 MED ORDER — SODIUM CHLORIDE 0.9 % IR SOLN
Status: DC | PRN
  Administered 2024-01-07 (×2): 6000 mL via INTRAVESICAL

## 2024-01-07 MED ORDER — CEFAZOLIN SODIUM-DEXTROSE 2-4 GM/100ML-% IV SOLN
2.0000 g | Freq: Once | INTRAVENOUS | Status: AC
  Administered 2024-01-07: 2 g via INTRAVENOUS

## 2024-01-07 MED ORDER — CHLORHEXIDINE GLUCONATE 0.12 % MT SOLN
15.0000 mL | Freq: Once | OROMUCOSAL | Status: AC
  Administered 2024-01-07: 15 mL via OROMUCOSAL

## 2024-01-07 MED ORDER — OXYCODONE HCL 5 MG PO TABS
ORAL_TABLET | ORAL | Status: AC
  Filled 2024-01-07: qty 1

## 2024-01-07 MED ORDER — MIDAZOLAM HCL 2 MG/2ML IJ SOLN
INTRAMUSCULAR | Status: DC | PRN
  Administered 2024-01-07: 2 mg via INTRAVENOUS

## 2024-01-07 MED ORDER — FENTANYL CITRATE (PF) 100 MCG/2ML IJ SOLN
INTRAMUSCULAR | Status: DC | PRN
  Administered 2024-01-07: 100 ug via INTRAVENOUS

## 2024-01-07 MED ORDER — FENTANYL CITRATE (PF) 100 MCG/2ML IJ SOLN
25.0000 ug | INTRAMUSCULAR | Status: DC | PRN
  Administered 2024-01-07 (×2): 50 ug via INTRAVENOUS

## 2024-01-07 MED ORDER — LIDOCAINE HCL (PF) 2 % IJ SOLN
INTRAMUSCULAR | Status: AC
  Filled 2024-01-07: qty 5

## 2024-01-07 MED ORDER — LACTATED RINGERS IV SOLN
INTRAVENOUS | Status: DC
Start: 2024-01-07 — End: 2024-01-07

## 2024-01-07 MED ORDER — ACETAMINOPHEN 10 MG/ML IV SOLN
INTRAVENOUS | Status: DC | PRN
Start: 2024-01-07 — End: 2024-01-07
  Administered 2024-01-07: 1000 mg via INTRAVENOUS

## 2024-01-07 MED ORDER — MIDAZOLAM HCL 2 MG/2ML IJ SOLN
INTRAMUSCULAR | Status: AC
  Filled 2024-01-07: qty 2

## 2024-01-07 MED ORDER — ONDANSETRON HCL 4 MG/2ML IJ SOLN
INTRAMUSCULAR | Status: AC
  Filled 2024-01-07: qty 2

## 2024-01-07 MED ORDER — LIDOCAINE HCL URETHRAL/MUCOSAL 2 % EX GEL
CUTANEOUS | Status: AC
  Filled 2024-01-07: qty 10

## 2024-01-07 MED ORDER — OXYCODONE HCL 5 MG/5ML PO SOLN: 5.0000 mg | Freq: Once | ORAL | Status: AC | PRN

## 2024-01-07 MED ORDER — STERILE WATER FOR IRRIGATION IR SOLN
Status: DC | PRN
  Administered 2024-01-07: 1000 mL
  Administered 2024-01-07 (×2): 500 mL

## 2024-01-07 MED ORDER — EPHEDRINE SULFATE-NACL 50-0.9 MG/10ML-% IV SOSY
PREFILLED_SYRINGE | INTRAVENOUS | Status: DC | PRN
Start: 2024-01-07 — End: 2024-01-07
  Administered 2024-01-07 (×2): 10 mg via INTRAVENOUS

## 2024-01-07 MED ORDER — LIDOCAINE HCL (CARDIAC) PF 100 MG/5ML IV SOSY
PREFILLED_SYRINGE | INTRAVENOUS | Status: DC | PRN
  Administered 2024-01-07: 100 mg via INTRAVENOUS

## 2024-01-07 MED ORDER — CHLORHEXIDINE GLUCONATE 0.12 % MT SOLN
OROMUCOSAL | Status: AC
  Filled 2024-01-07: qty 15

## 2024-01-07 MED ORDER — ROCURONIUM BROMIDE 10 MG/ML (PF) SYRINGE
PREFILLED_SYRINGE | INTRAVENOUS | Status: AC
  Filled 2024-01-07: qty 10

## 2024-01-07 MED ORDER — ROCURONIUM BROMIDE 100 MG/10ML IV SOLN
INTRAVENOUS | Status: DC | PRN
Start: 2024-01-07 — End: 2024-01-07
  Administered 2024-01-07: 60 mg via INTRAVENOUS

## 2024-01-07 MED ORDER — PROPOFOL 10 MG/ML IV BOLUS
INTRAVENOUS | Status: AC
  Filled 2024-01-07: qty 40

## 2024-01-07 MED ORDER — PROPOFOL 10 MG/ML IV BOLUS
INTRAVENOUS | Status: DC | PRN
  Administered 2024-01-07: 150 mg via INTRAVENOUS
  Administered 2024-01-07: 100 mg via INTRAVENOUS

## 2024-01-07 SURGICAL SUPPLY — 22 items
BAG DRAIN SIEMENS DORNER NS (MISCELLANEOUS) ×2 IMPLANT
BAG URINE DRAIN 2000ML AR STRL (UROLOGICAL SUPPLIES) IMPLANT
BASKET ZERO TIP 1.9FR (BASKET) IMPLANT
BRUSH SCRUB EZ 4% CHG (MISCELLANEOUS) IMPLANT
CATH FOL 2WAY LX 16X5 (CATHETERS) IMPLANT
CATH FOL 2WAY LX 18X5 (CATHETERS) IMPLANT
CATH SET URETHRAL DILATOR (CATHETERS) IMPLANT
CATH URETL OPEN 5X70 (CATHETERS) IMPLANT
CNTNR URN SCR LID CUP LEK RST (MISCELLANEOUS) IMPLANT
FIBER LASER MOSES 200 DFL (Laser) IMPLANT
FIBER LASER MOSES 365 DFL (Laser) IMPLANT
FIBER LASER MOSES 550 DFL (Laser) IMPLANT
GLOVE BIOGEL PI IND STRL 7.5 (GLOVE) ×2 IMPLANT
GOWN STRL REUS W/ TWL LRG LVL3 (GOWN DISPOSABLE) ×2 IMPLANT
GOWN STRL REUS W/ TWL XL LVL3 (GOWN DISPOSABLE) ×2 IMPLANT
KIT TURNOVER CYSTO (KITS) ×2 IMPLANT
PACK CYSTO AR (MISCELLANEOUS) ×2 IMPLANT
SET CYSTO W/LG BORE CLAMP LF (SET/KITS/TRAYS/PACK) ×2 IMPLANT
SOL .9 NS 3000ML IRR UROMATIC (IV SOLUTION) ×2 IMPLANT
SYR TOOMEY IRRIG 70ML (MISCELLANEOUS) ×1 IMPLANT
SYRINGE TOOMEY IRRIG 70ML (MISCELLANEOUS) ×2 IMPLANT
WATER STERILE IRR 500ML POUR (IV SOLUTION) ×2 IMPLANT
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
## 2024-01-07 NOTE — OR Nursing (Signed)
 Foley catheter removed in OR #10 by Dr. Estanislao Heimlich prior to procedure start.

## 2024-01-07 NOTE — Anesthesia Postprocedure Evaluation (Signed)
 Anesthesia Post Note  Patient: Lee [name]  Procedure(s) Performed: CYSTOSCOPY, WITH BLADDER CALCULUS LITHOLAPAXY (Bladder)  Patient location during evaluation: PACU Anesthesia Type: General Level of consciousness: awake and alert Pain management: pain level controlled Vital Signs Assessment: post-procedure vital signs reviewed and stable Respiratory status: spontaneous breathing, nonlabored ventilation, respiratory function stable and patient connected to nasal cannula oxygen Cardiovascular status: blood pressure returned to baseline and stable Postop Assessment: no apparent nausea or vomiting Anesthetic complications: no  No notable events documented.   Last Vitals:  Vitals:   01/07/24 1751 01/07/24 1804  BP: (!) 139/93 (!) 142/90  Pulse: 63   Resp: (!) 27 20  Temp:  (!) 36.3 C  SpO2: 100% 100%    Last Pain:  Vitals:   01/07/24 1804  TempSrc: Temporal  PainSc: 0-No pain                 Enrique Harvest

## 2024-01-07 NOTE — Transfer of Care (Signed)
 Immediate Anesthesia Transfer of Care Note  Patient: Lee [name]  Procedure(s) Performed: Procedure(s): CYSTOSCOPY, WITH BLADDER CALCULUS LITHOLAPAXY (N/A)  Patient Location: PACU  Anesthesia Type:General  Level of Consciousness: sedated  Airway & Oxygen Therapy: Patient Spontanous Breathing and Patient connected to face mask oxygen  Post-op Assessment: Report given to RN and Post -op Vital signs reviewed and stable  Post vital signs: Reviewed and stable  Last Vitals:  Vitals:   01/07/24 1244 01/07/24 1718  BP: 109/77 (!) 139/94  Pulse: 86 82  Resp: 18 12  Temp: 36.8 C (!) 36.2 C  SpO2: 100% 100%    Complications: No apparent anesthesia complications

## 2024-01-07 NOTE — Anesthesia Procedure Notes (Signed)
 Procedure Name: Intubation Date/Time: 01/07/2024 4:06 PM  Performed by: Sherrlyn Dolores, CRNAPre-anesthesia Checklist: Patient identified, Patient being monitored, Timeout performed, Emergency Drugs available and Suction available Patient Re-evaluated:Patient Re-evaluated prior to induction Oxygen Delivery Method: Circle system utilized Preoxygenation: Pre-oxygenation with 100% oxygen Induction Type: IV induction Ventilation: Mask ventilation without difficulty Laryngoscope Size: Mac and 3 Grade View: Grade I Tube type: Oral Tube size: 7.0 mm Number of attempts: 1 Airway Equipment and Method: Stylet Placement Confirmation: ETT inserted through vocal cords under direct vision, positive ETCO2 and breath sounds checked- equal and bilateral Secured at: 21 cm Tube secured with: Tape Dental Injury: Teeth and Oropharynx as per pre-operative assessment  Comments: DL x1 with MAC 3 blade, grade 1 view. Atraumatic intubation. Dentition unchanged from preop baseline.

## 2024-01-07 NOTE — Op Note (Signed)
 Date of procedure: 01/07/24  Preoperative diagnosis:  Bladder stones, >3cm  Postoperative diagnosis:  Same  Procedure: Cystolitholapaxy, >3cm  Surgeon: Redell Burnet, MD  Anesthesia: General  Complications: None  Intraoperative findings:  Small prostate, >15 bladder stones with the largest measuring ~3cm.  No suspicious lesions, ureteral orifices orthotopic bilaterally All stones fragmented to dust and irrigated free, no residual stones at conclusion of procedure  EBL: Minimal  Specimens: Stones for analysis  Drains: 18 French two-way Foley  Indication: Lee [name] is a 18 y.o. patient with neurogenic bladder managed with chronic Foley who presents with multiple bladder stones and poorly draining catheter.  Previously was on Topamax  and that has now been discontinued.  Here today for cystolitholapaxy.  After reviewing the management options for treatment, they elected to proceed with the above surgical procedure(s). We have discussed the potential benefits and risks of the procedure, side effects of the proposed treatment, the likelihood of the patient achieving the goals of the procedure, and any potential problems that might occur during the procedure or recuperation. Informed consent has been obtained.  Description of procedure:  The patient was taken to the operating room and general anesthesia was induced. SCDs were placed for DVT prophylaxis. The patient was placed in the dorsal lithotomy position, prepped and draped in the usual sterile fashion, and preoperative antibiotics were administered. A preoperative time-out was performed.   A 26 French resectoscope was used to intubate the urethra with the visual obturator and normal-appearing urethra was followed proximally in the bladder.  The prostate was small.  Thorough cystoscopy showed at least a dozen stones with the largest measuring approximately 3 cm.  There were no suspicious lesions.  I attempted use the manual stone  crusher, but the stones were too dense, and I changed to a 500 m laser fiber.  The laser fiber on settings of 2.0 J and 20 Hz was used to methodically dust all stones.  This took at least 30 minutes.  All stone fragments were meticulously irrigated free from the bladder.  Thorough cystoscopy showed no other stone fragments.  An 17 French two-way Foley catheter was placed with return of pink urine and 10 mL were placed in the balloon.  The catheter irrigated easily with saline and remained light pink.  Catheter was connected to drainage and this completed the procedure.  Disposition: Stable to PACU  Plan: -Consider 24-hour urine metabolic workup to prevent further bladder stones -Agree with stopping Topamax  -Follow-up in clinic with PA for 1 month Foley change  Redell Burnet, MD
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
## 2024-01-08 ENCOUNTER — Encounter: Payer: Self-pay | Admitting: Urology

## 2024-01-10 ENCOUNTER — Ambulatory Visit: Payer: Medicaid Other

## 2024-01-10 DIAGNOSIS — R2689 Other abnormalities of gait and mobility: Secondary | ICD-10-CM

## 2024-01-10 DIAGNOSIS — M6281 Muscle weakness (generalized): Secondary | ICD-10-CM

## 2024-01-10 DIAGNOSIS — G8221 Paraplegia, complete: Secondary | ICD-10-CM
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
## 2024-01-12 ENCOUNTER — Ambulatory Visit: Payer: Medicaid Other

## 2024-01-17 ENCOUNTER — Ambulatory Visit: Payer: Medicaid Other

## 2024-01-17 DIAGNOSIS — M6281 Muscle weakness (generalized): Secondary | ICD-10-CM

## 2024-01-17 DIAGNOSIS — G8221 Paraplegia, complete: Secondary | ICD-10-CM

## 2024-01-17 DIAGNOSIS — R2689 Other abnormalities of gait and mobility: Secondary | ICD-10-CM
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
## 2024-01-19 ENCOUNTER — Ambulatory Visit: Payer: Medicaid Other

## 2024-01-24 ENCOUNTER — Ambulatory Visit: Payer: Medicaid Other

## 2024-01-25 ENCOUNTER — Ambulatory Visit: Admitting: Physician Assistant

## 2024-01-26 ENCOUNTER — Ambulatory Visit: Admitting: Physical Therapy

## 2024-01-26 ENCOUNTER — Telehealth: Payer: Self-pay

## 2024-01-26 ENCOUNTER — Ambulatory Visit: Attending: Nurse Practitioner

## 2024-01-26 DIAGNOSIS — M6281 Muscle weakness (generalized): Secondary | ICD-10-CM | POA: Insufficient documentation

## 2024-01-26 DIAGNOSIS — R2689 Other abnormalities of gait and mobility: Secondary | ICD-10-CM | POA: Insufficient documentation

## 2024-01-26 DIAGNOSIS — S24104A Unspecified injury at T11-T12 level of thoracic spinal cord, initial encounter: Secondary | ICD-10-CM | POA: Insufficient documentation

## 2024-01-26 DIAGNOSIS — G8221 Paraplegia, complete: Secondary | ICD-10-CM | POA: Insufficient documentation

## 2024-01-26 LAB — STONE ANALYSIS
Brushite (CaHPO4 2H2O): 30 %
Calcium Oxalate Monohydrate: 10 %
Calcium Phosphate (Carbonate): 50 %
Struvite (MgNH4PO4 6H2O): 10 %
Weight Calculi: 1921 mg

## 2024-01-26 NOTE — Telephone Encounter (Signed)
 Patient called due to no show. Patient states they forgot since they had another appointment. Reminded of next appointment time.   Wassim Kirksey  Brain Cahill PT, DPT Physical Therapist - [facility]  Outpatient Physical Therapy- Main Campus [phone]

## 2024-01-28 ENCOUNTER — Encounter
Payer: Medicaid Other | Attending: Physical Medicine and Rehabilitation | Admitting: Physical Medicine and Rehabilitation

## 2024-01-28 ENCOUNTER — Encounter: Payer: Self-pay | Admitting: Physical Medicine and Rehabilitation

## 2024-01-28 VITALS — BP 107/71 | HR 107

## 2024-01-28 DIAGNOSIS — N210 Calculus in bladder: Secondary | ICD-10-CM | POA: Diagnosis not present

## 2024-01-28 DIAGNOSIS — Z993 Dependence on wheelchair: Secondary | ICD-10-CM | POA: Insufficient documentation

## 2024-01-28 DIAGNOSIS — R519 Headache, unspecified: Secondary | ICD-10-CM | POA: Insufficient documentation

## 2024-01-28 DIAGNOSIS — G8220 Paraplegia, unspecified: Secondary | ICD-10-CM | POA: Diagnosis present

## 2024-01-28 MED ORDER — AMITRIPTYLINE HCL 25 MG PO TABS: 25.0000 mg | ORAL_TABLET | Freq: Every day | ORAL | 5 refills | Status: DC

## 2024-01-28 MED ORDER — GABAPENTIN 800 MG PO TABS: 800.0000 mg | ORAL_TABLET | Freq: Three times a day (TID) | ORAL | 5 refills | Status: DC

## 2024-01-28 MED ORDER — BACLOFEN 10 MG PO TABS: 10.0000 mg | ORAL_TABLET | Freq: Three times a day (TID) | ORAL | 5 refills | Status: DC
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
## 2024-01-31 ENCOUNTER — Ambulatory Visit: Admitting: Physical Therapy

## 2024-01-31 DIAGNOSIS — S24104A Unspecified injury at T11-T12 level of thoracic spinal cord, initial encounter: Secondary | ICD-10-CM

## 2024-01-31 DIAGNOSIS — M6281 Muscle weakness (generalized): Secondary | ICD-10-CM | POA: Diagnosis present

## 2024-01-31 DIAGNOSIS — G8221 Paraplegia, complete: Secondary | ICD-10-CM

## 2024-01-31 DIAGNOSIS — R2689 Other abnormalities of gait and mobility: Secondary | ICD-10-CM | POA: Diagnosis present
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
## 2024-02-01 ENCOUNTER — Encounter: Payer: Self-pay | Admitting: Physical Medicine and Rehabilitation

## 2024-02-03 ENCOUNTER — Ambulatory Visit: Admitting: Physician Assistant

## 2024-02-03 ENCOUNTER — Ambulatory Visit (INDEPENDENT_AMBULATORY_CARE_PROVIDER_SITE_OTHER): Admitting: Physician Assistant

## 2024-02-03 DIAGNOSIS — N210 Calculus in bladder: Secondary | ICD-10-CM

## 2024-02-03 DIAGNOSIS — Z466 Encounter for fitting and adjustment of urinary device: Secondary | ICD-10-CM | POA: Diagnosis not present

## 2024-02-03 DIAGNOSIS — R8290 Unspecified abnormal findings in urine: Secondary | ICD-10-CM | POA: Diagnosis not present

## 2024-02-03 MED ORDER — CIPROFLOXACIN HCL 250 MG PO TABS: 250.0000 mg | ORAL_TABLET | Freq: Two times a day (BID) | ORAL | 0 refills | Status: AC

## 2024-02-03 NOTE — Patient Instructions (Signed)
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
## 2024-02-08 LAB — CULTURE, URINE COMPREHENSIVE

## 2024-02-09 ENCOUNTER — Ambulatory Visit: Admitting: Physical Therapy

## 2024-02-09 DIAGNOSIS — R2689 Other abnormalities of gait and mobility: Secondary | ICD-10-CM | POA: Diagnosis not present

## 2024-02-09 DIAGNOSIS — M6281 Muscle weakness (generalized): Secondary | ICD-10-CM

## 2024-02-09 DIAGNOSIS — G8221 Paraplegia, complete: Secondary | ICD-10-CM

## 2024-02-09 DIAGNOSIS — S24104A Unspecified injury at T11-T12 level of thoracic spinal cord, initial encounter: Secondary | ICD-10-CM
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
## 2024-02-11 ENCOUNTER — Ambulatory Visit: Payer: Self-pay | Admitting: Physician Assistant

## 2024-02-16 ENCOUNTER — Ambulatory Visit: Admitting: Physical Therapy

## 2024-02-16 DIAGNOSIS — M6281 Muscle weakness (generalized): Secondary | ICD-10-CM

## 2024-02-16 DIAGNOSIS — G8221 Paraplegia, complete: Secondary | ICD-10-CM

## 2024-02-16 DIAGNOSIS — R2689 Other abnormalities of gait and mobility: Secondary | ICD-10-CM

## 2024-02-16 DIAGNOSIS — S24104A Unspecified injury at T11-T12 level of thoracic spinal cord, initial encounter: Secondary | ICD-10-CM
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
## 2024-03-01 ENCOUNTER — Ambulatory Visit: Attending: Nurse Practitioner | Admitting: Physical Therapy

## 2024-03-01 ENCOUNTER — Encounter (HOSPITAL_BASED_OUTPATIENT_CLINIC_OR_DEPARTMENT_OTHER): Payer: Self-pay

## 2024-03-01 ENCOUNTER — Emergency Department (HOSPITAL_BASED_OUTPATIENT_CLINIC_OR_DEPARTMENT_OTHER)
Admission: EM | Admit: 2024-03-01 | Discharge: 2024-03-01 | Disposition: A | Discharge status: Home / Self Care | Attending: Emergency Medicine | Admitting: Emergency Medicine

## 2024-03-01 ENCOUNTER — Telehealth: Payer: Self-pay | Admitting: Physical Therapy

## 2024-03-01 DIAGNOSIS — S24104A Unspecified injury at T11-T12 level of thoracic spinal cord, initial encounter: Secondary | ICD-10-CM | POA: Insufficient documentation

## 2024-03-01 DIAGNOSIS — Z79899 Other long term (current) drug therapy: Secondary | ICD-10-CM | POA: Diagnosis not present

## 2024-03-01 DIAGNOSIS — M6281 Muscle weakness (generalized): Secondary | ICD-10-CM | POA: Insufficient documentation

## 2024-03-01 DIAGNOSIS — R2689 Other abnormalities of gait and mobility: Secondary | ICD-10-CM | POA: Insufficient documentation

## 2024-03-01 DIAGNOSIS — T83091A Other mechanical complication of indwelling urethral catheter, initial encounter: Secondary | ICD-10-CM | POA: Diagnosis not present

## 2024-03-01 DIAGNOSIS — T839XXA Unspecified complication of genitourinary prosthetic device, implant and graft, initial encounter: Secondary | ICD-10-CM

## 2024-03-01 MED ORDER — OXYCODONE HCL 5 MG PO TABS
5.0000 mg | ORAL_TABLET | ORAL | Status: AC
  Administered 2024-03-01: 5 mg via ORAL
  Filled 2024-03-01: qty 1

## 2024-03-01 MED ORDER — FOSFOMYCIN TROMETHAMINE 3 G PO PACK: 3.0000 g | PACK | Freq: Once | ORAL | Status: DC

## 2024-03-01 NOTE — Telephone Encounter (Signed)
 Pt contacted via telephone and author left voice mail informing of missed appointment and informed pt of future PT appointment date and time. Also provided phone number for [facility] to set up appointment for [facility] consultation. [phone]  Aurora Lees PT, DPT  Physical Therapist - [facility]  3:24 PM 03/01/24
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
## 2024-03-01 NOTE — ED Triage Notes (Signed)
 Pt states his foley catheter has been clogged all day Bladder appears distended

## 2024-03-01 NOTE — Discharge Instructions (Addendum)
 Please call urology to schedule follow-up for today's visit.  If you start having symptoms of urinary tract infection they can test your urine at your follow-up appointment and consider antibiotic.  Return to ER with new or worsening symptoms.

## 2024-03-01 NOTE — ED Notes (Signed)
 ED Provider at bedside.

## 2024-03-07 ENCOUNTER — Ambulatory Visit

## 2024-03-07 DIAGNOSIS — S24104A Unspecified injury at T11-T12 level of thoracic spinal cord, initial encounter: Secondary | ICD-10-CM | POA: Diagnosis present

## 2024-03-07 DIAGNOSIS — R2689 Other abnormalities of gait and mobility: Secondary | ICD-10-CM | POA: Diagnosis present

## 2024-03-07 DIAGNOSIS — M6281 Muscle weakness (generalized): Secondary | ICD-10-CM
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
## 2024-03-08 ENCOUNTER — Ambulatory Visit
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
## 2024-03-15 ENCOUNTER — Ambulatory Visit

## 2024-03-15 DIAGNOSIS — R2689 Other abnormalities of gait and mobility: Secondary | ICD-10-CM

## 2024-03-15 DIAGNOSIS — S24104A Unspecified injury at T11-T12 level of thoracic spinal cord, initial encounter: Secondary | ICD-10-CM

## 2024-03-15 DIAGNOSIS — M6281 Muscle weakness (generalized): Secondary | ICD-10-CM

## 2024-03-21 ENCOUNTER — Ambulatory Visit: Attending: Nurse Practitioner | Admitting: Physical Therapy

## 2024-03-21 DIAGNOSIS — G8221 Paraplegia, complete: Secondary | ICD-10-CM | POA: Diagnosis present

## 2024-03-21 DIAGNOSIS — S24104A Unspecified injury at T11-T12 level of thoracic spinal cord, initial encounter: Secondary | ICD-10-CM | POA: Insufficient documentation

## 2024-03-21 DIAGNOSIS — R2689 Other abnormalities of gait and mobility: Secondary | ICD-10-CM | POA: Insufficient documentation

## 2024-03-21 DIAGNOSIS — M6281 Muscle weakness (generalized): Secondary | ICD-10-CM | POA: Diagnosis present
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
## 2024-03-27 ENCOUNTER — Ambulatory Visit: Admitting: Physician Assistant

## 2024-03-28 ENCOUNTER — Ambulatory Visit: Admitting: Physician Assistant

## 2024-03-29 ENCOUNTER — Ambulatory Visit: Admitting: Physician Assistant

## 2024-03-29 ENCOUNTER — Ambulatory Visit: Admitting: Physical Therapy

## 2024-03-31 ENCOUNTER — Encounter: Payer: Self-pay | Admitting: Physician Assistant

## 2024-04-05 ENCOUNTER — Ambulatory Visit: Admitting: Urology

## 2024-04-05 ENCOUNTER — Telehealth: Payer: Self-pay | Admitting: Physical Therapy

## 2024-04-05 ENCOUNTER — Ambulatory Visit: Admitting: Physical Therapy

## 2024-04-05 VITALS — BP 135/90 | HR 67 | Ht 60.0 in | Wt 140.0 lb

## 2024-04-05 DIAGNOSIS — N319 Neuromuscular dysfunction of bladder, unspecified: Secondary | ICD-10-CM

## 2024-04-05 DIAGNOSIS — R339 Retention of urine, unspecified: Secondary | ICD-10-CM | POA: Diagnosis not present

## 2024-04-05 NOTE — Patient Instructions (Signed)

## 2024-04-05 NOTE — Progress Notes (Signed)
 See nurse note [id number] change.

## 2024-04-05 NOTE — Progress Notes (Signed)
 Cath Change/ Replacement  Patient is present today for a catheter change due to urinary retention. 10ml of water  was removed from the balloon, a 16FR coude foley cath was removed without difficulty. Patient was cleaned and prepped in a sterile fashion with betadine. A 16FR coude foley cath was replaced into the bladder, no complications were noted. Urine return was noted 30ml and urine was pink in color. The balloon was filled with 10ml of sterile water . An overnight bag was attached for drainage. Patient was given proper instruction on catheter care including a reminder on how to irrigate with sterile water  and renacidin as patient complains of increased sediment, as well as instructions to keep slack in catheter tubing in order to prevent penis erosion. Patient was also instructed to complete 24hr urine collection per previous office note from Samantha Vailancourt, [place], patient states he has been hesitant to complete due to fear,verbal reassurance provided and instructions on how to complete.   Performed by: Ferman Basilio, CMA (AAMA)  Follow up: RTC in 1 month for Voiding trial

## 2024-04-05 NOTE — Telephone Encounter (Signed)
 Pt contacted via telephone and dino spoke with pt informing of missed appointment and informed pt of future PT appointment date and time. Pt states that he was not aware that he was approved for additional PT visits. Plans to be at next scheduled appointment on 4/22 at 1615.   Massie Dollar PT, DPT  Physical Therapist - [place]  [facility]  1:51 PM 04/05/24

## 2024-04-06 ENCOUNTER — Telehealth: Payer: Self-pay | Admitting: Urology

## 2024-04-06 DIAGNOSIS — R8290 Unspecified abnormal findings in urine: Secondary | ICD-10-CM

## 2024-04-06 NOTE — Telephone Encounter (Signed)
[facility] reordered

## 2024-04-06 NOTE — Telephone Encounter (Signed)
 Pt called and stated they no longer have there 24 hour urine kit and would like to be sent a new one. Pt is requesting it to be sent to [address] [place], [place] [address]

## 2024-04-11 ENCOUNTER — Ambulatory Visit: Admitting: Physical Therapy

## 2024-04-11 DIAGNOSIS — M6281 Muscle weakness (generalized): Secondary | ICD-10-CM

## 2024-04-11 DIAGNOSIS — R2689 Other abnormalities of gait and mobility: Secondary | ICD-10-CM | POA: Diagnosis not present

## 2024-04-11 DIAGNOSIS — S24104A Unspecified injury at T11-T12 level of thoracic spinal cord, initial encounter: Secondary | ICD-10-CM
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
## 2024-04-18 NOTE — Progress Notes (Signed)
 Sent to [facility] for a co signature.

## 2024-04-19 ENCOUNTER — Ambulatory Visit: Admitting: Physical Therapy

## 2024-04-19 DIAGNOSIS — S24104A Unspecified injury at T11-T12 level of thoracic spinal cord, initial encounter: Secondary | ICD-10-CM

## 2024-04-19 DIAGNOSIS — M6281 Muscle weakness (generalized): Secondary | ICD-10-CM

## 2024-04-19 DIAGNOSIS — R2689 Other abnormalities of gait and mobility: Secondary | ICD-10-CM | POA: Diagnosis not present

## 2024-04-19 DIAGNOSIS — G8221 Paraplegia, complete: Secondary | ICD-10-CM
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
## 2024-04-25 ENCOUNTER — Ambulatory Visit: Attending: Nurse Practitioner | Admitting: Physical Therapy

## 2024-05-02 ENCOUNTER — Ambulatory Visit: Admitting: Physical Therapy

## 2024-05-02 ENCOUNTER — Telehealth: Payer: Self-pay

## 2024-05-02 NOTE — Telephone Encounter (Signed)
 Patient called and discussed plan of care. Will wait for future PT appointments until after braces arrive to conserve visits. Patient agreeable with plan of care.   Hamdi Kley  Leopoldo PT, DPT Physical Therapist - [facility]  Outpatient Physical Therapy- Main Campus [phone]

## 2024-05-08 ENCOUNTER — Ambulatory Visit: Admitting: Physician Assistant

## 2024-05-09 ENCOUNTER — Ambulatory Visit: Admitting: Physician Assistant

## 2024-05-09 ENCOUNTER — Ambulatory Visit: Payer: Self-pay | Admitting: Physician Assistant

## 2024-05-09 ENCOUNTER — Ambulatory Visit: Admitting: Physical Therapy

## 2024-05-09 VITALS — BP 113/60 | HR 91 | Temp 98.0°F

## 2024-05-09 DIAGNOSIS — R8290 Unspecified abnormal findings in urine: Secondary | ICD-10-CM | POA: Diagnosis not present

## 2024-05-09 DIAGNOSIS — Z466 Encounter for fitting and adjustment of urinary device: Secondary | ICD-10-CM | POA: Diagnosis not present

## 2024-05-09 DIAGNOSIS — N390 Urinary tract infection, site not specified: Secondary | ICD-10-CM | POA: Diagnosis not present

## 2024-05-09 LAB — URINALYSIS, COMPLETE
Bilirubin, UA: NEGATIVE
Glucose, UA: NEGATIVE
Ketones, UA: NEGATIVE
Nitrite, UA: POSITIVE — AB
Specific Gravity, UA: 1.02 (ref 1.005–1.030)
Urobilinogen, Ur: 0.2 mg/dL (ref 0.2–1.0)
pH, UA: 8.5 — ABNORMAL HIGH (ref 5.0–7.5)

## 2024-05-09 LAB — MICROSCOPIC EXAMINATION
RBC, Urine: >30 /HPF — AB (ref 0–2)
WBC, UA: >30 /HPF — AB (ref 0–5)

## 2024-05-09 MED ORDER — FOSFOMYCIN TROMETHAMINE 3 G PO PACK: 3.0000 g | PACK | Freq: Once | ORAL | 1 refills | Status: AC

## 2024-05-09 NOTE — Progress Notes (Signed)
 Cath Change/ Replacement  Patient is present today for a catheter change due to urinary retention.  8ml of water  was removed from the balloon, a 16FR coude foley cath was removed without difficulty.  Patient was cleaned and prepped in a sterile fashion with betadine and 2% lidocaine  jelly was instilled into the urethra. A 18 FR coude foley cath was replaced into the bladder, no complications were noted. Urine return was noted 30ml and urine was yellow in color. The balloon was filled with 10ml of sterile water . A night bag was attached for drainage.  Patient tolerated well.   Performed by: Taniah Reinecke, PA-C  Additional notes: He reports lower abdominal pain, malodorous urine, and significant urinary sediment and is concern for UTI today.  Urine sample obtained from new catheter.  Will send in empiric fosfomycin and send for culture.  He would like to reattempt a voiding trial we will plan for that at his next visit.  Agree with not doing that today when he is currently having UTI symptoms.  I still think he would benefit from a metabolic workup.  Given the degree of urinary sediment today I am concerned that he is could be at risk for recurrence of his bladder stone.  Follow up: Return in about 4 weeks (around 06/06/2024) for Voiding trial.

## 2024-05-10 ENCOUNTER — Ambulatory Visit: Admitting: Physician Assistant

## 2024-05-12 ENCOUNTER — Encounter: Attending: Physical Medicine and Rehabilitation | Admitting: Physical Medicine and Rehabilitation

## 2024-05-12 DIAGNOSIS — G8220 Paraplegia, unspecified: Secondary | ICD-10-CM | POA: Insufficient documentation

## 2024-05-12 DIAGNOSIS — Z993 Dependence on wheelchair: Secondary | ICD-10-CM | POA: Insufficient documentation

## 2024-05-12 DIAGNOSIS — R519 Headache, unspecified: Secondary | ICD-10-CM | POA: Insufficient documentation

## 2024-05-12 DIAGNOSIS — N210 Calculus in bladder: Secondary | ICD-10-CM | POA: Insufficient documentation

## 2024-05-15 LAB — CULTURE, URINE COMPREHENSIVE

## 2024-05-16 ENCOUNTER — Ambulatory Visit: Admitting: Physical Therapy

## 2024-05-24 ENCOUNTER — Emergency Department (HOSPITAL_BASED_OUTPATIENT_CLINIC_OR_DEPARTMENT_OTHER)
Admission: EM | Admit: 2024-05-24 | Discharge: 2024-05-24 | Disposition: A | Discharge status: Home / Self Care | Attending: Emergency Medicine | Admitting: Emergency Medicine

## 2024-05-24 ENCOUNTER — Telehealth: Payer: Self-pay

## 2024-05-24 ENCOUNTER — Ambulatory Visit: Admitting: Physical Therapy

## 2024-05-24 ENCOUNTER — Other Ambulatory Visit: Payer: Self-pay

## 2024-05-24 DIAGNOSIS — Y828 Other medical devices associated with adverse incidents: Secondary | ICD-10-CM | POA: Insufficient documentation

## 2024-05-24 DIAGNOSIS — T83511A Infection and inflammatory reaction due to indwelling urethral catheter, initial encounter: Secondary | ICD-10-CM | POA: Diagnosis present

## 2024-05-24 DIAGNOSIS — N390 Urinary tract infection, site not specified: Secondary | ICD-10-CM | POA: Insufficient documentation

## 2024-05-24 LAB — URINALYSIS, ROUTINE W REFLEX MICROSCOPIC
Bilirubin Urine: NEGATIVE
Glucose, UA: NEGATIVE mg/dL
Ketones, ur: NEGATIVE mg/dL
Nitrite: POSITIVE — AB
Protein, ur: 30 mg/dL — AB
Specific Gravity, Urine: 1.031 — ABNORMAL HIGH (ref 1.005–1.030)
WBC, UA: >50 WBC/hpf (ref 0–5)
pH: 6.5 (ref 5.0–8.0)

## 2024-05-24 MED ORDER — CEFPODOXIME PROXETIL 100 MG PO TABS: 100.0000 mg | ORAL_TABLET | Freq: Two times a day (BID) | ORAL | 0 refills | Status: AC

## 2024-05-24 NOTE — Discharge Instructions (Addendum)
 Take antibiotic 2 times daily by mouth.  Confirm with urology this is appropriate antibiotic for current urinary tract infection.  It is recommended to follow-up with urology to further manage symptoms.  If symptoms worsen return to the ED for further evaluation.
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
## 2024-05-24 NOTE — Telephone Encounter (Signed)
 Pt callin stating pain in bladder and vomiting. He was hard to understand on phone. Patient sounded like he was in distress. Spoke with the PA and pt was instructed to seek immediate attention in the ER. Pt stated understanding.

## 2024-05-24 NOTE — ED Notes (Signed)
 Bladder scan done showing value of 0 cc

## 2024-05-24 NOTE — ED Triage Notes (Signed)
 Patient reports bladder pain since this morning. States white sediment in catheter for past several days. Recent antibiotics for UTI.

## 2024-05-26 ENCOUNTER — Ambulatory Visit: Admitting: Physician Assistant

## 2024-05-29 ENCOUNTER — Ambulatory Visit (INDEPENDENT_AMBULATORY_CARE_PROVIDER_SITE_OTHER): Admitting: Physician Assistant

## 2024-05-29 ENCOUNTER — Encounter: Payer: Self-pay | Admitting: Physician Assistant

## 2024-05-29 VITALS — BP 116/65 | HR 97 | Ht 60.0 in | Wt 140.0 lb

## 2024-05-29 DIAGNOSIS — Z466 Encounter for fitting and adjustment of urinary device: Secondary | ICD-10-CM

## 2024-05-29 NOTE — Progress Notes (Signed)
 Cath Change/ Replacement  Patient is present today for a catheter change due to urinary retention.  8ml of water  was removed from the balloon, a 16FR coude foley cath was removed without difficulty.  Patient was cleaned and prepped in a sterile fashion with betadine and 2% lidocaine  jelly was instilled into the urethra. A 16 FR coude foley cath was replaced into the bladder, no complications were noted. Urine return was noted 10ml and urine was yellow in color. The balloon was filled with 10ml of sterile water . A night bag was attached for drainage.  Patient tolerated well.    Performed by: Rio Kidane, PA-C  Additional notes: Hypertonic sphincter today, challenging insertion. He remains on abx. He had some urethral bleeding after insertion, but it resolved quickly. He would like to do a voiding trial in 2 weeks. I'll have him get a KUB prior that day to make sure he hasn't grown any new bladder stones.  Follow up: Return in about 10 days (around 06/08/2024) for Voiding trial with KUB prior.

## 2024-05-30 ENCOUNTER — Ambulatory Visit: Admitting: Physical Therapy

## 2024-06-06 ENCOUNTER — Ambulatory Visit

## 2024-06-08 ENCOUNTER — Ambulatory Visit: Admitting: Physician Assistant

## 2024-06-13 ENCOUNTER — Ambulatory Visit: Admitting: Physical Therapy

## 2024-06-20 ENCOUNTER — Ambulatory Visit: Admitting: Physical Therapy

## 2024-06-21 ENCOUNTER — Ambulatory Visit
Admission: RE | Admit: 2024-06-21 | Discharge: 2024-06-21 | Disposition: A | Discharge status: Home / Self Care | Attending: Physician Assistant | Admitting: Physician Assistant

## 2024-06-21 ENCOUNTER — Ambulatory Visit
Admission: RE | Admit: 2024-06-21 | Discharge: 2024-06-21 | Disposition: A | Discharge status: Home / Self Care | Source: Ambulatory Visit | Attending: Physician Assistant | Admitting: Physician Assistant

## 2024-06-21 ENCOUNTER — Ambulatory Visit (INDEPENDENT_AMBULATORY_CARE_PROVIDER_SITE_OTHER): Admitting: Physician Assistant

## 2024-06-21 VITALS — BP 110/78 | HR 97 | Wt 140.0 lb

## 2024-06-21 DIAGNOSIS — Z466 Encounter for fitting and adjustment of urinary device: Secondary | ICD-10-CM

## 2024-06-21 DIAGNOSIS — N210 Calculus in bladder: Secondary | ICD-10-CM

## 2024-06-21 NOTE — Patient Instructions (Addendum)
 Please plan to complete your LithoLink test, including a 24-hour urine collection, as soon as you can. Drink 60-80 ounces of water  daily, and use 2 sticks of LithoLyte water  enhancer daily (photo below--you can buy this on [employer] without a prescription). Start cranberry supplements for urinary tract health twice daily on an empty stomach.  0 Litholink Instructions [facility]   You will receive a box/kit in the mail that will have a urine jug and instructions in the kit.  When the box arrives you will need to call our office [phone] to schedule a LAB appointment.   You will need to do a 24hour urine and this should be done during the days that our office will be open.  For example any day from Sunday through Thursday.   If you take Vitamin C 100mg  or greater please stop this 5 days prior to collection.   How to collect the urine sample: On the day you start the urine sample this 1st morning urine should NOT be collected.  For the rest of the day including all night urines should be collected.  On the next morning the 1st urine should be collected and then you will be finished with the urine collections.   You will need to bring the box with you on your LAB appointment day after urine has been collected and all instructions are complete in the box.  Your blood will be drawn and the box will be collected by our Lab employee to be sent off for analysis.   When urine and blood is complete you will need to schedule a follow up appointment for lab results.
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
## 2024-06-27 ENCOUNTER — Ambulatory Visit: Admitting: Physical Therapy

## 2024-07-04 ENCOUNTER — Ambulatory Visit: Admitting: Physical Therapy

## 2024-07-11 ENCOUNTER — Ambulatory Visit: Admitting: Physical Therapy

## 2024-07-17 ENCOUNTER — Ambulatory Visit: Admitting: Physician Assistant

## 2024-07-17 NOTE — Progress Notes (Deleted)
 Catheter Removal  Patient is present today for a catheter removal.  10ml of water  was drained from the balloon. A 16FR  coude foley cath was removed from the bladder, no complications were noted. Patient tolerated well.  Performed by: Myrtha Dante LATHER  Follow up/ Additional notes: Return to clinic @3 :20pm for bladder scan.

## 2024-07-18 ENCOUNTER — Ambulatory Visit: Attending: Nurse Practitioner | Admitting: Physical Therapy

## 2024-07-18 ENCOUNTER — Ambulatory Visit: Admitting: Physical Therapy

## 2024-07-18 DIAGNOSIS — R2689 Other abnormalities of gait and mobility: Secondary | ICD-10-CM | POA: Insufficient documentation

## 2024-07-18 DIAGNOSIS — G8221 Paraplegia, complete: Secondary | ICD-10-CM | POA: Insufficient documentation

## 2024-07-18 DIAGNOSIS — S24104A Unspecified injury at T11-T12 level of thoracic spinal cord, initial encounter: Secondary | ICD-10-CM | POA: Diagnosis present

## 2024-07-18 DIAGNOSIS — M6281 Muscle weakness (generalized): Secondary | ICD-10-CM | POA: Insufficient documentation
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
## 2024-07-19 ENCOUNTER — Telehealth: Payer: Self-pay | Admitting: Physician Assistant

## 2024-07-19 NOTE — Telephone Encounter (Signed)
 Patient called because he missed his V/T appointments on 07/17/24, and needs to reschedule. He is concerned about being able to come twice in the same day due to transportation. He also mentioned maybe just having cath change. Patient was advised that [facility] is out of the office until Monday 11/3, and that office will contact him regarding scheduling when she returns. He was advised to go to ED if any issues with catheter between now and then. Patient verbalized understanding.

## 2024-07-25 ENCOUNTER — Ambulatory Visit: Attending: Nurse Practitioner | Admitting: Physical Therapy

## 2024-07-25 ENCOUNTER — Ambulatory Visit: Admitting: Physical Therapy

## 2024-07-25 DIAGNOSIS — M6281 Muscle weakness (generalized): Secondary | ICD-10-CM | POA: Insufficient documentation

## 2024-07-25 DIAGNOSIS — G8221 Paraplegia, complete: Secondary | ICD-10-CM | POA: Insufficient documentation

## 2024-07-25 DIAGNOSIS — S24104A Unspecified injury at T11-T12 level of thoracic spinal cord, initial encounter: Secondary | ICD-10-CM | POA: Diagnosis present

## 2024-07-25 DIAGNOSIS — R2689 Other abnormalities of gait and mobility: Secondary | ICD-10-CM | POA: Diagnosis present
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
## 2024-07-27 ENCOUNTER — Ambulatory Visit (INDEPENDENT_AMBULATORY_CARE_PROVIDER_SITE_OTHER): Admitting: Physician Assistant

## 2024-07-27 VITALS — BP 112/75 | HR 93 | Wt 150.0 lb

## 2024-07-27 DIAGNOSIS — R339 Retention of urine, unspecified: Secondary | ICD-10-CM

## 2024-07-27 DIAGNOSIS — Z466 Encounter for fitting and adjustment of urinary device: Secondary | ICD-10-CM

## 2024-07-27 NOTE — Progress Notes (Signed)
 Cath Change/ Replacement  Patient is present today for a catheter change due to urinary retention.  8ml of water  was removed from the balloon, a 16FR coude foley cath was removed without difficulty.  Patient was cleaned and prepped in a sterile fashion with betadine and 2% lidocaine  jelly was instilled into the urethra. A 16 FR coude foley cath was replaced into the bladder, no complications were noted. Urine return was noted <9ml and urine was yellow in color. The balloon was filled with 10ml of sterile water . A night bag was attached for drainage.  Patient tolerated well.    Performed by: Aslan Himes, PA-C   Additional notes: Lee [name] is cost prohibitive. I encouraged Lee [name] to complete LithoLink ASAP so we can consider potassium citrate. He has been drinking about 70oz of fluids daily and has noticed decreased sediment in his urine.  Follow up: Return in about 4 weeks (around 08/24/2024) for Voiding trial.

## 2024-08-02 ENCOUNTER — Ambulatory Visit: Admitting: Physical Therapy

## 2024-08-02 DIAGNOSIS — S24104A Unspecified injury at T11-T12 level of thoracic spinal cord, initial encounter: Secondary | ICD-10-CM

## 2024-08-02 DIAGNOSIS — G8221 Paraplegia, complete: Secondary | ICD-10-CM

## 2024-08-02 DIAGNOSIS — M6281 Muscle weakness (generalized): Secondary | ICD-10-CM

## 2024-08-02 DIAGNOSIS — R2689 Other abnormalities of gait and mobility: Secondary | ICD-10-CM | POA: Diagnosis not present
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
## 2024-08-08 ENCOUNTER — Ambulatory Visit: Admitting: Physical Therapy

## 2024-08-08 DIAGNOSIS — R2689 Other abnormalities of gait and mobility: Secondary | ICD-10-CM | POA: Diagnosis not present

## 2024-08-08 DIAGNOSIS — G8221 Paraplegia, complete: Secondary | ICD-10-CM

## 2024-08-08 DIAGNOSIS — M6281 Muscle weakness (generalized): Secondary | ICD-10-CM

## 2024-08-08 DIAGNOSIS — S24104A Unspecified injury at T11-T12 level of thoracic spinal cord, initial encounter: Secondary | ICD-10-CM
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
## 2024-08-16 ENCOUNTER — Encounter: Payer: Self-pay | Admitting: Physical Therapy

## 2024-08-16 ENCOUNTER — Ambulatory Visit: Admitting: Physical Therapy

## 2024-08-16 DIAGNOSIS — R2689 Other abnormalities of gait and mobility: Secondary | ICD-10-CM | POA: Diagnosis not present

## 2024-08-16 DIAGNOSIS — M6281 Muscle weakness (generalized): Secondary | ICD-10-CM

## 2024-08-16 DIAGNOSIS — G8221 Paraplegia, complete: Secondary | ICD-10-CM

## 2024-08-16 DIAGNOSIS — S24104A Unspecified injury at T11-T12 level of thoracic spinal cord, initial encounter: Secondary | ICD-10-CM
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
## 2024-08-22 ENCOUNTER — Ambulatory Visit: Admitting: Physical Therapy

## 2024-08-24 ENCOUNTER — Ambulatory Visit: Admitting: Physical Therapy

## 2024-08-24 ENCOUNTER — Ambulatory Visit: Admitting: Physician Assistant

## 2024-08-28 ENCOUNTER — Other Ambulatory Visit: Payer: Self-pay | Admitting: Physical Medicine and Rehabilitation

## 2024-08-29 ENCOUNTER — Ambulatory Visit: Admitting: Physical Therapy

## 2024-08-29 DIAGNOSIS — R2689 Other abnormalities of gait and mobility: Secondary | ICD-10-CM | POA: Insufficient documentation

## 2024-08-29 DIAGNOSIS — M6281 Muscle weakness (generalized): Secondary | ICD-10-CM | POA: Insufficient documentation

## 2024-08-29 DIAGNOSIS — S24104A Unspecified injury at T11-T12 level of thoracic spinal cord, initial encounter: Secondary | ICD-10-CM | POA: Insufficient documentation

## 2024-08-29 DIAGNOSIS — G8221 Paraplegia, complete: Secondary | ICD-10-CM | POA: Insufficient documentation
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
## 2024-09-04 ENCOUNTER — Ambulatory Visit: Admitting: Physician Assistant

## 2024-09-04 ENCOUNTER — Ambulatory Visit (INDEPENDENT_AMBULATORY_CARE_PROVIDER_SITE_OTHER): Admitting: Physician Assistant

## 2024-09-04 DIAGNOSIS — N210 Calculus in bladder: Secondary | ICD-10-CM | POA: Diagnosis not present

## 2024-09-04 DIAGNOSIS — N319 Neuromuscular dysfunction of bladder, unspecified: Secondary | ICD-10-CM | POA: Diagnosis not present
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
## 2024-09-04 NOTE — Progress Notes (Signed)
 Catheter Removal  Patient is present today for a catheter removal for AM V/T.  8 ml of water  was drained from the balloon. A 16FR foley cath was removed from the bladder, no complications were noted. Patient tolerated well.  Performed by: Mathew Pinal, RN  Follow up/ Additional notes: This afternoon for PM voiding trial

## 2024-09-05 ENCOUNTER — Ambulatory Visit: Admitting: Physical Therapy

## 2024-09-06 ENCOUNTER — Ambulatory Visit: Admitting: Physical Therapy

## 2024-09-06 DIAGNOSIS — M6281 Muscle weakness (generalized): Secondary | ICD-10-CM

## 2024-09-06 DIAGNOSIS — R2689 Other abnormalities of gait and mobility: Secondary | ICD-10-CM | POA: Diagnosis not present

## 2024-09-06 DIAGNOSIS — G8221 Paraplegia, complete: Secondary | ICD-10-CM

## 2024-09-06 DIAGNOSIS — S24104A Unspecified injury at T11-T12 level of thoracic spinal cord, initial encounter: Secondary | ICD-10-CM
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
## 2024-09-12 ENCOUNTER — Telehealth: Payer: Self-pay | Admitting: Physical Therapy

## 2024-09-12 ENCOUNTER — Ambulatory Visit: Admitting: Physical Therapy

## 2024-09-12 NOTE — Telephone Encounter (Signed)
 Pt contacted via telephone and author left voice mail informing of missed appointment and informed pt of future PT appointment date and time.       Massie Dollar PT, DPT  Physical Therapist - [place]  [facility]  4:54 PM 09/12/2024

## 2024-09-19 ENCOUNTER — Ambulatory Visit: Admitting: Physical Therapy

## 2024-09-19 DIAGNOSIS — M6281 Muscle weakness (generalized): Secondary | ICD-10-CM

## 2024-09-19 DIAGNOSIS — S24104A Unspecified injury at T11-T12 level of thoracic spinal cord, initial encounter: Secondary | ICD-10-CM

## 2024-09-19 DIAGNOSIS — R2689 Other abnormalities of gait and mobility: Secondary | ICD-10-CM | POA: Diagnosis not present

## 2024-09-19 DIAGNOSIS — G8221 Paraplegia, complete: Secondary | ICD-10-CM
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
## 2024-09-26 ENCOUNTER — Ambulatory Visit: Admitting: Physical Therapy

## 2024-10-02 ENCOUNTER — Ambulatory Visit (INDEPENDENT_AMBULATORY_CARE_PROVIDER_SITE_OTHER): Admitting: Physician Assistant

## 2024-10-02 DIAGNOSIS — Z466 Encounter for fitting and adjustment of urinary device: Secondary | ICD-10-CM | POA: Diagnosis not present

## 2024-10-02 NOTE — Progress Notes (Signed)
 Cath Change/ Replacement  Patient is present today for a catheter change due to urinary retention.  8ml of water  was removed from the balloon, a 16FR coude foley cath was removed without difficulty.  Patient was cleaned and prepped in a sterile fashion with betadine and 2% lidocaine  jelly was instilled into the urethra. A 16 FR coude foley cath was replaced into the bladder, no complications were noted. Urine return was noted 30ml and urine was yellow in color. The balloon was filled with 10ml of sterile water . A night bag was attached for drainage.  Patient tolerated well.    Performed by: Juelle Dickmann, PA-C   Follow up: Return in about 4 weeks (around 10/30/2024) for Catheter exchange.

## 2024-10-03 ENCOUNTER — Ambulatory Visit: Attending: Family Medicine | Admitting: Physical Therapy

## 2024-10-03 DIAGNOSIS — M6281 Muscle weakness (generalized): Secondary | ICD-10-CM | POA: Insufficient documentation

## 2024-10-03 DIAGNOSIS — S24104A Unspecified injury at T11-T12 level of thoracic spinal cord, initial encounter: Secondary | ICD-10-CM | POA: Insufficient documentation

## 2024-10-03 DIAGNOSIS — R2689 Other abnormalities of gait and mobility: Secondary | ICD-10-CM | POA: Insufficient documentation

## 2024-10-03 DIAGNOSIS — G8221 Paraplegia, complete: Secondary | ICD-10-CM | POA: Insufficient documentation
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
## 2024-10-06 ENCOUNTER — Encounter: Attending: Physical Medicine and Rehabilitation | Admitting: Physical Medicine and Rehabilitation

## 2024-10-06 ENCOUNTER — Encounter: Payer: Self-pay | Admitting: Physical Medicine and Rehabilitation

## 2024-10-06 VITALS — BP 111/71 | HR 88 | Ht 60.0 in

## 2024-10-06 DIAGNOSIS — Z993 Dependence on wheelchair: Secondary | ICD-10-CM | POA: Diagnosis not present

## 2024-10-06 DIAGNOSIS — K592 Neurogenic bowel, not elsewhere classified: Secondary | ICD-10-CM | POA: Insufficient documentation

## 2024-10-06 DIAGNOSIS — G8222 Paraplegia, incomplete: Secondary | ICD-10-CM | POA: Diagnosis not present

## 2024-10-06 DIAGNOSIS — M21371 Foot drop, right foot: Secondary | ICD-10-CM | POA: Insufficient documentation

## 2024-10-06 DIAGNOSIS — R519 Headache, unspecified: Secondary | ICD-10-CM | POA: Insufficient documentation

## 2024-10-06 DIAGNOSIS — N319 Neuromuscular dysfunction of bladder, unspecified: Secondary | ICD-10-CM | POA: Insufficient documentation

## 2024-10-06 DIAGNOSIS — M21372 Foot drop, left foot: Secondary | ICD-10-CM | POA: Insufficient documentation

## 2024-10-06 MED ORDER — AMITRIPTYLINE HCL 25 MG PO TABS: 25.0000 mg | ORAL_TABLET | Freq: Every day | ORAL | 1 refills | Status: AC

## 2024-10-06 MED ORDER — BACLOFEN 10 MG PO TABS: 10.0000 mg | ORAL_TABLET | Freq: Three times a day (TID) | ORAL | 1 refills | Status: AC

## 2024-10-06 MED ORDER — GABAPENTIN 800 MG PO TABS: 800.0000 mg | ORAL_TABLET | Freq: Three times a day (TID) | ORAL | 1 refills | Status: AC
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
## 2024-10-10 ENCOUNTER — Ambulatory Visit: Admitting: Physical Therapy

## 2024-10-11 ENCOUNTER — Ambulatory Visit: Admitting: Physical Therapy

## 2024-10-11 DIAGNOSIS — S24104A Unspecified injury at T11-T12 level of thoracic spinal cord, initial encounter: Secondary | ICD-10-CM

## 2024-10-11 DIAGNOSIS — G8221 Paraplegia, complete: Secondary | ICD-10-CM

## 2024-10-11 DIAGNOSIS — R2689 Other abnormalities of gait and mobility: Secondary | ICD-10-CM

## 2024-10-11 DIAGNOSIS — M6281 Muscle weakness (generalized): Secondary | ICD-10-CM
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
## 2024-10-16 ENCOUNTER — Other Ambulatory Visit: Payer: Self-pay | Admitting: Physical Medicine and Rehabilitation

## 2024-10-18 ENCOUNTER — Other Ambulatory Visit: Payer: Self-pay

## 2024-10-23 ENCOUNTER — Ambulatory Visit: Admitting: Physical Therapy

## 2024-10-30 ENCOUNTER — Ambulatory Visit: Admitting: Physical Therapy

## 2024-10-31 ENCOUNTER — Ambulatory Visit: Admitting: Physician Assistant

## 2024-11-06 ENCOUNTER — Ambulatory Visit: Admitting: Physical Therapy

## 2024-11-15 ENCOUNTER — Ambulatory Visit: Admitting: Physical Therapy

## 2024-11-22 ENCOUNTER — Ambulatory Visit: Admitting: Physical Therapy

## 2024-11-27 ENCOUNTER — Ambulatory Visit: Admitting: Physical Therapy

## 2024-12-05 ENCOUNTER — Ambulatory Visit: Admitting: Physical Therapy

## 2024-12-13 ENCOUNTER — Ambulatory Visit: Admitting: Physical Therapy

## 2024-12-19 ENCOUNTER — Ambulatory Visit: Admitting: Physical Therapy

## 2024-12-26 ENCOUNTER — Ambulatory Visit: Admitting: Physical Therapy

## 2025-01-02 ENCOUNTER — Ambulatory Visit: Admitting: Physical Therapy

## 2025-01-08 ENCOUNTER — Encounter: Admitting: Physical Medicine and Rehabilitation

## 2025-01-09 ENCOUNTER — Ambulatory Visit: Admitting: Physical Therapy

## 2025-01-16 ENCOUNTER — Ambulatory Visit: Admitting: Physical Therapy

## 2025-01-23 ENCOUNTER — Ambulatory Visit: Admitting: Physical Therapy

## 2025-01-30 ENCOUNTER — Ambulatory Visit: Admitting: Physical Therapy

## 2025-02-06 ENCOUNTER — Ambulatory Visit: Admitting: Physical Therapy

## 2025-02-14 ENCOUNTER — Ambulatory Visit: Admitting: Physical Therapy
